# Patient Record
Sex: Female | Born: 1995 | Hispanic: Yes | Marital: Single | Smoking: Never smoker
Health system: Southern US, Community
[De-identification: ages and names within clinical notes are randomized; demographics above are authoritative.]

## PROBLEM LIST (undated history)

## (undated) ENCOUNTER — Inpatient Hospital Stay: Payer: Self-pay

## (undated) DIAGNOSIS — F419 Anxiety disorder, unspecified: Secondary | ICD-10-CM

## (undated) DIAGNOSIS — Z789 Other specified health status: Secondary | ICD-10-CM

## (undated) HISTORY — PX: NO PAST SURGERIES: SHX2092

---
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
## 2013-12-21 ENCOUNTER — Encounter (HOSPITAL_COMMUNITY): Payer: Self-pay | Admitting: Emergency Medicine

## 2013-12-21 DIAGNOSIS — O209 Hemorrhage in early pregnancy, unspecified: Secondary | ICD-10-CM | POA: Insufficient documentation

## 2013-12-21 NOTE — ED Notes (Signed)
Pt here from home with c/o vaginal bleeding that started  2 days ago with some small clots , no pain associated , pt was told that she was [redacted] weeks pregnant at the health dept.

## 2013-12-22 ENCOUNTER — Emergency Department (HOSPITAL_COMMUNITY)
Admission: EM | Admit: 2013-12-22 | Discharge: 2013-12-22 | Discharge status: Against Medical Advice | Payer: Self-pay | Attending: Emergency Medicine | Admitting: Emergency Medicine

## 2013-12-22 NOTE — ED Notes (Signed)
Pt informed of delay; pt decided to leave

## 2015-11-26 ENCOUNTER — Other Ambulatory Visit: Payer: Self-pay | Admitting: Advanced Practice Midwife

## 2015-11-26 DIAGNOSIS — Z3491 Encounter for supervision of normal pregnancy, unspecified, first trimester: Secondary | ICD-10-CM

## 2015-11-26 DIAGNOSIS — Z369 Encounter for antenatal screening, unspecified: Secondary | ICD-10-CM

## 2015-11-27 LAB — OB RESULTS CONSOLE GBS: STREP GROUP B AG: NEGATIVE

## 2015-11-27 LAB — OB RESULTS CONSOLE RPR: RPR: NONREACTIVE

## 2015-11-27 LAB — OB RESULTS CONSOLE VARICELLA ZOSTER ANTIBODY, IGG: VARICELLA IGG: IMMUNE

## 2015-11-27 LAB — OB RESULTS CONSOLE ABO/RH: RH TYPE: POSITIVE

## 2015-11-27 LAB — OB RESULTS CONSOLE RUBELLA ANTIBODY, IGM: RUBELLA: IMMUNE

## 2015-11-27 LAB — OB RESULTS CONSOLE HIV ANTIBODY (ROUTINE TESTING): HIV: NONREACTIVE

## 2015-11-27 LAB — OB RESULTS CONSOLE ANTIBODY SCREEN: Antibody Screen: NEGATIVE

## 2015-11-27 LAB — OB RESULTS CONSOLE GC/CHLAMYDIA
Chlamydia: NEGATIVE
Gonorrhea: NEGATIVE

## 2015-11-27 LAB — OB RESULTS CONSOLE HEPATITIS B SURFACE ANTIGEN: HEP B S AG: NEGATIVE

## 2015-12-01 ENCOUNTER — Ambulatory Visit
Admission: RE | Admit: 2015-12-01 | Discharge: 2015-12-01 | Disposition: A | Discharge status: Home / Self Care | Payer: Self-pay | Source: Ambulatory Visit | Attending: Advanced Practice Midwife | Admitting: Advanced Practice Midwife

## 2015-12-01 DIAGNOSIS — Z3491 Encounter for supervision of normal pregnancy, unspecified, first trimester: Secondary | ICD-10-CM

## 2015-12-01 DIAGNOSIS — Z3A01 Less than 8 weeks gestation of pregnancy: Secondary | ICD-10-CM | POA: Insufficient documentation

## 2015-12-01 DIAGNOSIS — Z36 Encounter for antenatal screening of mother: Secondary | ICD-10-CM | POA: Insufficient documentation

## 2015-12-18 ENCOUNTER — Ambulatory Visit (HOSPITAL_BASED_OUTPATIENT_CLINIC_OR_DEPARTMENT_OTHER)
Admission: RE | Admit: 2015-12-18 | Discharge: 2015-12-18 | Disposition: A | Discharge status: Home / Self Care | Payer: Self-pay | Source: Ambulatory Visit | Attending: Obstetrics & Gynecology | Admitting: Obstetrics & Gynecology

## 2015-12-18 ENCOUNTER — Ambulatory Visit
Admission: RE | Admit: 2015-12-18 | Discharge: 2015-12-18 | Disposition: A | Discharge status: Home / Self Care | Payer: Self-pay | Source: Ambulatory Visit | Attending: Advanced Practice Midwife | Admitting: Advanced Practice Midwife

## 2015-12-18 VITALS — BP 124/61 | HR 90 | Temp 98.2°F | Resp 18 | Ht 62.4 in | Wt 163.2 lb

## 2015-12-18 DIAGNOSIS — Z369 Encounter for antenatal screening, unspecified: Secondary | ICD-10-CM

## 2015-12-18 DIAGNOSIS — Z3490 Encounter for supervision of normal pregnancy, unspecified, unspecified trimester: Secondary | ICD-10-CM | POA: Insufficient documentation

## 2015-12-18 DIAGNOSIS — Z36 Encounter for antenatal screening of mother: Secondary | ICD-10-CM

## 2015-12-18 HISTORY — DX: Other specified health status: Z78.9

## 2015-12-18 NOTE — Progress Notes (Addendum)
Referring physician:  [facility] Department Length of Consultation: 30 minutes   [name]  was referred to [facility] for genetic counseling to review prenatal screening and testing options.  This note summarizes the information we discussed.    We offered the following routine screening tests for this pregnancy:  First trimester screening, which includes nuchal translucency ultrasound screen and first trimester maternal serum marker screening.  The nuchal translucency has approximately an 80% detection rate for Down syndrome and can be positive for other chromosome abnormalities as well as congenital heart defects.  When combined with a maternal serum marker screening, the detection rate is up to 90% for Down syndrome and up to 97% for trisomy 18.     Maternal serum marker screening, a blood test that measures pregnancy proteins, can provide risk assessments for Down syndrome, trisomy 18, and open neural tube defects (spina bifida, anencephaly). Because it does not directly examine the fetus, it cannot positively diagnose or rule out these problems.  Targeted ultrasound uses high frequency sound waves to create an image of the developing fetus.  An ultrasound is often recommended as a routine means of evaluating the pregnancy.  It is also used to screen for fetal anatomy problems (for example, a heart defect) that might be suggestive of a chromosomal or other abnormality.   Should these screening tests indicate an increased concern, then the following additional testing options would be offered:  The chorionic villus sampling procedure is available for first trimester chromosome analysis.  This involves the withdrawal of a small amount of chorionic villi (tissue from the developing placenta).  Risk of pregnancy loss is estimated to be approximately 1 in 200 to 1 in 100 (0.5 to 1%).  There is approximately a 1% (1 in 100) chance that the CVS chromosome  results will be unclear.  Chorionic villi cannot be tested for neural tube defects.     Amniocentesis involves the removal of a small amount of amniotic fluid from the sac surrounding the fetus with the use of a thin needle inserted through the maternal abdomen and uterus.  Ultrasound guidance is used throughout the procedure.  Fetal cells from amniotic fluid are directly evaluated and > 99.5% of chromosome problems and > 98% of open neural tube defects can be detected. This procedure is generally performed after the 15th week of pregnancy.  The main risks to this procedure include complications leading to miscarriage in less than 1 in 200 cases (0.5%).  As another option for information if the pregnancy is suspected to be an an increased chance for certain chromosome conditions, we also reviewed the availability of cell free fetal DNA testing from maternal blood to determine whether or not the baby may have either Down syndrome, trisomy 65, or trisomy 25.  This test utilizes a maternal blood sample and DNA sequencing technology to isolate circulating cell free fetal DNA from maternal plasma.  The fetal DNA can then be analyzed for DNA sequences that are derived from the three most common chromosomes involved in aneuploidy, chromosomes 13, 18, and 21.  If the overall amount of DNA is greater than the expected level for any of these chromosomes, aneuploidy is suspected.  While we do not consider it a replacement for invasive testing and karyotype analysis, a negative result from this testing would be reassuring, though not a guarantee of a normal chromosome complement for the baby.  An abnormal result is certainly suggestive of an abnormal chromosome  complement, though we would still recommend CVS or amniocentesis to confirm any findings from this testing.  We obtained a detailed family history and pregnancy history.  The family history was reported to be unremarkable for birth defects, mental retardation,  recurrent pregnancy loss or known chromosome abnormalities.  [name] stated that this is her second pregnancy.  She and her partner have a healthy 21 year old daughter.  She reported no complications or exposures in this pregnancy that would be expected to increase the risk for birth defects.  After consideration of the options, [name] elected to decline all screening tests.  She elected to return to our clinic at approximately 18 weeks for an anatomy ultrasound.  Because she declined screening and there was no other medical indication for an ultrasound, no imaging was done today.  [name] was encouraged to call with questions or concerns.  We can be contacted at [phone].  Sonia Andersoneborah F. Wells, MS, CGC  I was present in the clinic for this evaluation. [email or web address]

## 2015-12-28 NOTE — L&D Delivery Note (Signed)
Delivery Note  First Stage: Labor onset: 06/21/16 @ 2210 Augmentation : none Analgesia Eliezer Lofts/Anesthesia intrapartum: 1mg  Stadol IVP x 1 SROM at 2210  Second Stage: Complete dilation at 0259 Onset of pushing at 0259 FHR second stage: 135 bpm/ Moderate variability/ +accels/ no decels   Delivery of a viable female "[name]" at 0310 by Carlean JewsMeredith Jaydis Duchene, CNM in LOA position No nuchal cord Cord double clamped after cessation of pulsation, cut by FOB Cord blood sample collected   Third Stage: Placenta delivered via Tomasa BlaseSchultz intact with 3 VC @ 0314 Placenta disposition: hospital disposal Uterine tone boggy with massage / bleeding brisk - 800mcg Cytotec given rectally, Pitocin 500mL bolus infusing - fundus firm with massage  1st degree perineal laceration identified  Anesthesia for repair: 1% Lidocaine 30 mL Repair 3.0 Vicryl, 2.0 Vicryl Est. Blood Loss (mL): 450 mL  Complications: none  Mom to postpartum.  Baby to Couplet care / Skin to Skin.  Newborn: Birth Weight:  Pending  Apgar Scores: 8, 9 Feeding planned: Breast/Bottle  Carlean JewsMeredith Shawnta Zimbelman, CNM

## 2015-12-30 ENCOUNTER — Encounter: Payer: Self-pay | Admitting: Emergency Medicine

## 2015-12-30 ENCOUNTER — Emergency Department: Payer: Self-pay

## 2015-12-30 ENCOUNTER — Emergency Department
Admission: EM | Admit: 2015-12-30 | Discharge: 2015-12-30 | Disposition: A | Discharge status: Home / Self Care | Payer: Self-pay | Attending: Emergency Medicine | Admitting: Emergency Medicine

## 2015-12-30 DIAGNOSIS — Z3490 Encounter for supervision of normal pregnancy, unspecified, unspecified trimester: Secondary | ICD-10-CM

## 2015-12-30 DIAGNOSIS — R1031 Right lower quadrant pain: Secondary | ICD-10-CM | POA: Insufficient documentation

## 2015-12-30 DIAGNOSIS — Z79899 Other long term (current) drug therapy: Secondary | ICD-10-CM | POA: Insufficient documentation

## 2015-12-30 DIAGNOSIS — O9989 Other specified diseases and conditions complicating pregnancy, childbirth and the puerperium: Secondary | ICD-10-CM | POA: Insufficient documentation

## 2015-12-30 DIAGNOSIS — Z3A13 13 weeks gestation of pregnancy: Secondary | ICD-10-CM | POA: Insufficient documentation

## 2015-12-30 DIAGNOSIS — R102 Pelvic and perineal pain: Secondary | ICD-10-CM | POA: Insufficient documentation

## 2015-12-30 LAB — CBC
HCT: 37.8 % (ref 35.0–47.0)
Hemoglobin: 13 g/dL (ref 12.0–16.0)
MCH: 30.6 pg (ref 26.0–34.0)
MCHC: 34.4 g/dL (ref 32.0–36.0)
MCV: 88.7 fL (ref 80.0–100.0)
Platelets: 219 10*3/uL (ref 150–440)
RBC: 4.26 MIL/uL (ref 3.80–5.20)
RDW: 13.1 % (ref 11.5–14.5)
WBC: 11.4 10*3/uL — ABNORMAL HIGH (ref 3.6–11.0)

## 2015-12-30 LAB — CHLAMYDIA/NGC RT PCR (ARMC ONLY)
Chlamydia Tr: NOT DETECTED
N gonorrhoeae: NOT DETECTED

## 2015-12-30 LAB — URINALYSIS COMPLETE WITH MICROSCOPIC (ARMC ONLY)
BILIRUBIN URINE: NEGATIVE
GLUCOSE, UA: NEGATIVE mg/dL
Hgb urine dipstick: NEGATIVE
Ketones, ur: NEGATIVE mg/dL
Nitrite: NEGATIVE
Protein, ur: NEGATIVE mg/dL
Specific Gravity, Urine: 1.013 (ref 1.005–1.030)
pH: 5 (ref 5.0–8.0)

## 2015-12-30 LAB — WET PREP, GENITAL
CLUE CELLS WET PREP: NONE SEEN
SPERM: NONE SEEN
TRICH WET PREP: NONE SEEN

## 2015-12-30 LAB — HCG, QUANTITATIVE, PREGNANCY: HCG, BETA CHAIN, QUANT, S: 101207 m[IU]/mL — AB (ref <5)

## 2015-12-30 LAB — ABO/RH: ABO/RH(D): O POS

## 2015-12-30 MED ORDER — ACETAMINOPHEN 325 MG PO TABS
650.0000 mg | ORAL_TABLET | Freq: Once | ORAL | Status: AC
  Administered 2015-12-30: 650 mg via ORAL
  Filled 2015-12-30: qty 2

## 2015-12-30 NOTE — ED Notes (Signed)
Patient ambulatory to triage with steady gait, without difficulty or distress noted; pt reports awoke with left lower abd pain; st approx [redacted]wks pregnant; pt at health department with no complications

## 2015-12-30 NOTE — ED Provider Notes (Signed)
[facility] Emergency Department Provider Note  ____________________________________________  Time seen: Approximately 521 AM  I have reviewed the triage vital signs and the nursing notes.   HISTORY  Chief Complaint Abdominal Pain    HPI Sonia Rodriguez is a 20 y.o. female who comes into the hospital today with some lower abdominal pain. The patient is [redacted] weeks pregnant and reports that 2 AM she got a sharp pain in her right lower quadrant. She reports that the pain stopped but it returned and stated. She reports that initially the pain was a 7 out of 10 but is currently a 5 out of 10 in intensity. The patient had an ultrasound done on December 5 and did not have any complications at that time. The patient denies any nausea or vomiting and has no problems when she urinates. The patient has not had new vaginal bleeding or discharge. The patient has a history of an ectopic pregnancy in 2014 but reports it was on the left. The patient is a G3 P1 011. The patient did not take anything for the pain but decided to come in for evaluation.   Past Medical History  Diagnosis Date  . Medical history non-contributory     Patient Active Problem List   Diagnosis Date Noted  . Pregnancy 12/18/2015  . First trimester screening 12/18/2015    Past Surgical History  Procedure Laterality Date  . No past surgeries      Current Outpatient Rx  Name  Route  Sig  Dispense  Refill  . Prenatal Vit-Fe Fumarate-FA (PRENATAL MULTIVITAMIN) TABS tablet   Oral   Take 1 tablet by mouth daily at 12 noon.           Allergies Review of patient's allergies indicates no known allergies.  No family history on file.  Social History Social History  Substance Use Topics  . Smoking status: Never Smoker   . Smokeless tobacco: Never Used  . Alcohol Use: No    Review of Systems Constitutional: No fever/chills Eyes: No visual changes. ENT: No sore throat. Cardiovascular:  Denies chest pain. Respiratory: Denies shortness of breath. Gastrointestinal:  abdominal pain.  No nausea, no vomiting.  No diarrhea.  No constipation. Genitourinary: Negative for dysuria. Musculoskeletal: Negative for back pain. Skin: Negative for rash. Neurological: Negative for headaches, focal weakness or numbness.  10-point ROS otherwise negative.  ____________________________________________   PHYSICAL EXAM:  VITAL SIGNS: ED Triage Vitals  Enc Vitals Group     BP 12/30/15 0454 121/63 mmHg     Pulse Rate 12/30/15 0454 73     Resp 12/30/15 0454 18     Temp 12/30/15 0454 98 F (36.7 C)     Temp Source 12/30/15 0454 Oral     SpO2 12/30/15 0454 100 %     Weight 12/30/15 0454 163 lb (73.936 kg)     Height 12/30/15 0454 5\' 2"  (1.575 m)     Head Cir --      Peak Flow --      Pain Score 12/30/15 0455 6     Pain Loc --      Pain Edu? --      Excl. in GC? --     Constitutional: Alert and oriented. Well appearing and in mild distress. Eyes: Conjunctivae are normal. PERRL. EOMI. Head: Atraumatic. Nose: No congestion/rhinnorhea. Mouth/Throat: Mucous membranes are moist.  Oropharynx non-erythematous. Cardiovascular: Normal rate, regular rhythm. Grossly normal heart sounds.  Good peripheral circulation. Respiratory: Normal respiratory  effort.  No retractions. Lungs CTAB. Gastrointestinal: Soft and nontender. No distention. Positive bowel sounds Genitourinary: Normal external genitalia with right adnexal tenderness to palpation, cervix closed and uterus enlarged consistent with pregnancy dates. Musculoskeletal: No lower extremity tenderness nor edema.  No joint effusions. Neurologic:  Normal speech and language.  Skin:  Skin is warm, dry and intact.  Psychiatric: Mood and affect are normal.   ____________________________________________   LABS (all labs ordered are listed, but only abnormal results are displayed)  Labs Reviewed  WET PREP, GENITAL - Abnormal; Notable for  the following:    Yeast Wet Prep HPF POC FEW (*)    WBC, Wet Prep HPF POC MANY (*)    All other components within normal limits  CBC - Abnormal; Notable for the following:    WBC 11.4 (*)    All other components within normal limits  HCG, QUANTITATIVE, PREGNANCY - Abnormal; Notable for the following:    hCG, Beta Chain, Quant, S 101207 (*)    All other components within normal limits  URINALYSIS COMPLETEWITH MICROSCOPIC ([facility] ONLY) - Abnormal; Notable for the following:    Color, Urine YELLOW (*)    APPearance CLEAR (*)    Leukocytes, UA TRACE (*)    Bacteria, UA RARE (*)    Squamous Epithelial / LPF 0-5 (*)    All other components within normal limits  CHLAMYDIA/NGC RT PCR ([facility] ONLY)  ABO/RH   ____________________________________________  EKG  None ____________________________________________  RADIOLOGY  Ultrasound: Single live intrauterine pregnancy with crown-rump length of 6.9 cm corresponding to a gestational age of [redacted] weeks and 1 day. This matches the gestational age of [redacted] weeks 4 days by LMP reflecting an estimated date of delivery of 07/02/2016. ____________________________________________   PROCEDURES  Procedure(s) performed: None  Critical Care performed: No  ____________________________________________   INITIAL IMPRESSION / ASSESSMENT AND PLAN / ED COURSE  Pertinent labs & imaging results that were available during my care of the patient were reviewed by me and considered in my medical decision making (see chart for details).  This is a 20 year old female who comes into the hospital today with right lower quadrant pain. The patient is [redacted] weeks pregnant. The patient has more adnexal tenderness although it comes and goes compared to right lower quadrant tenderness to palpation. We checked the patient's blood work to include a CBC and a urinalysis and also did a wet prep. I will send the patient for an ultrasound to evaluate possible cause of her  pain and give her dose of Tylenol. Patient will be reassessed when she's returned from ultrasound.  I did go back in to reassess the patient. At this time the patient reports that she's feeling well. I explained to the patient the thought of this pain not reflecting appendicitis. The pain is intermittent she's had no secondary symptoms such as anorexia, nausea or fever. This pain is more consistent with either ovarian pain which her right ovary measures larger than her left ovary or some early round ligament pain which can start in the second trimester. I explained to the patient that she can always use Tylenol to treat her pain and that she should follow back up with her OB/GYN. I also explained to the patient and her husband that should she develop any fevers, nausea, vomiting or worsened pain she should return to the emergency department. The patient displays understanding and agrees with the plan as stated. She'll be discharged home. ____________________________________________   FINAL CLINICAL IMPRESSION(S) / ED  DIAGNOSES  Final diagnoses:  Pregnancy  Right lower quadrant abdominal pain      Rebecka Apley, MD 12/30/15 [phone]

## 2015-12-30 NOTE — ED Notes (Signed)
Pr presents to the ED with c/o right lower abdominal pain that started earlier this morning. Pt denies any N/V/D or fever; pt also denies any issues with urinary burning, difficulty, or frequency. Pt is currently [redacted] weeks pregnant; denies any c/o vaginal bleeding or d/c. Pt is A&O, in NAD, with respirations regular, even and unlabored, with s/o and family at bedside.

## 2015-12-30 NOTE — Discharge Instructions (Signed)
Abdominal Pain, Adult Many things can cause abdominal pain. Usually, abdominal pain is not caused by a disease and will improve without treatment. It can often be observed and treated at home. Your health care provider will do a physical exam and possibly order blood tests and X-rays to help determine the seriousness of your pain. However, in many cases, more time must pass before a clear cause of the pain can be found. Before that point, your health care provider may not know if you need more testing or further treatment. HOME CARE INSTRUCTIONS Monitor your abdominal pain for any changes. The following actions may help to alleviate any discomfort you are experiencing:  Only take over-the-counter or prescription medicines as directed by your health care provider.  Do not take laxatives unless directed to do so by your health care provider.  Try a clear liquid diet (broth, tea, or water) as directed by your health care provider. Slowly move to a bland diet as tolerated. SEEK MEDICAL CARE IF:  You have unexplained abdominal pain.  You have abdominal pain associated with nausea or diarrhea.  You have pain when you urinate or have a bowel movement.  You experience abdominal pain that wakes you in the night.  You have abdominal pain that is worsened or improved by eating food.  You have abdominal pain that is worsened with eating fatty foods.  You have a fever. SEEK IMMEDIATE MEDICAL CARE IF:  Your pain does not go away within 2 hours.  You keep throwing up (vomiting).  Your pain is felt only in portions of the abdomen, such as the right side or the left lower portion of the abdomen.  You pass bloody or black tarry stools. MAKE SURE YOU:  Understand these instructions.  Will watch your condition.  Will get help right away if you are not doing well or get worse.   This information is not intended to replace advice given to you by your health care provider. Make sure you discuss  any questions you have with your health care provider.   Document Released: 09/22/2005 Document Revised: 09/03/2015 Document Reviewed: 08/22/2013 Elsevier Interactive Patient Education 2016 Elsevier Inc.  Round Ligament Pain The round ligament is a cord of muscle and tissue that helps to support the uterus. It can become a source of pain during pregnancy if it becomes stretched or twisted as the baby grows. The pain usually begins in the second trimester of pregnancy, and it can come and go until the baby is delivered. It is not a serious problem, and it does not cause harm to the baby. Round ligament pain is usually a short, sharp, and pinching pain, but it can also be a dull, lingering, and aching pain. The pain is felt in the lower side of the abdomen or in the groin. It usually starts deep in the groin and moves up to the outside of the hip area. Pain can occur with:  A sudden change in position.  Rolling over in bed.  Coughing or sneezing.  Physical activity. HOME CARE INSTRUCTIONS Watch your condition for any changes. Take these steps to help with your pain:  When the pain starts, relax. Then try:  Sitting down.  Flexing your knees up to your abdomen.  Lying on your side with one pillow under your abdomen and another pillow between your legs.  Sitting in a warm bath for 15-20 minutes or until the pain goes away.  Take over-the-counter and prescription medicines only as told by  your health care provider.  Move slowly when you sit and stand.  Avoid long walks if they cause pain.  Stop or lessen your physical activities if they cause pain. SEEK MEDICAL CARE IF:  Your pain does not go away with treatment.  You feel pain in your back that you did not have before.  Your medicine is not helping. SEEK IMMEDIATE MEDICAL CARE IF:  You develop a fever or chills.  You develop uterine contractions.  You develop vaginal bleeding.  You develop nausea or vomiting.  You  develop diarrhea.  You have pain when you urinate.   This information is not intended to replace advice given to you by your health care provider. Make sure you discuss any questions you have with your health care provider.   Document Released: 09/21/2008 Document Revised: 03/06/2012 Document Reviewed: 02/19/2015 Elsevier Interactive Patient Education [employer]2016 Elsevier Inc.

## 2016-01-19 LAB — HM PAP SMEAR: HM Pap smear: NEGATIVE

## 2016-01-19 NOTE — Addendum Note (Signed)
Encounter addended by: [place] Lorenzo Arscott, MD on: 01/19/2016 12:51 PM<BR>     Documentation filed: Charges VN, Notes Section, Problem List

## 2016-02-05 ENCOUNTER — Ambulatory Visit
Admission: RE | Admit: 2016-02-05 | Discharge: 2016-02-05 | Disposition: A | Discharge status: Home / Self Care | Payer: Self-pay | Source: Ambulatory Visit | Attending: Obstetrics and Gynecology | Admitting: Obstetrics and Gynecology

## 2016-02-05 ENCOUNTER — Ambulatory Visit: Payer: Self-pay

## 2016-02-05 VITALS — BP 125/78 | HR 108 | Temp 98.2°F | Resp 18 | Wt 168.2 lb

## 2016-02-05 DIAGNOSIS — Z3490 Encounter for supervision of normal pregnancy, unspecified, unspecified trimester: Secondary | ICD-10-CM

## 2016-02-05 DIAGNOSIS — Z369 Encounter for antenatal screening, unspecified: Secondary | ICD-10-CM

## 2016-02-05 DIAGNOSIS — Z3A18 18 weeks gestation of pregnancy: Secondary | ICD-10-CM | POA: Insufficient documentation

## 2016-02-05 DIAGNOSIS — Z36 Encounter for antenatal screening of mother: Secondary | ICD-10-CM | POA: Insufficient documentation

## 2016-04-06 IMAGING — US US OB COMP LESS 14 WK
1 series · 13 of 28 positions shown · non-contrast
Comparison: Pelvic ultrasound performed 12/01/2015

CLINICAL DATA: Acute onset of right lower quadrant abdominal pain.
Initial encounter.

EXAM:
OBSTETRIC <14 WK ULTRASOUND
TECHNIQUE: Transabdominal ultrasound was performed for evaluation of the
gestation as well as the maternal uterus and adnexal regions.

[Series 1: us ob comp less 14 wk · 0.22mm/px · 13 of 50 slices shown]
[im 2/50]
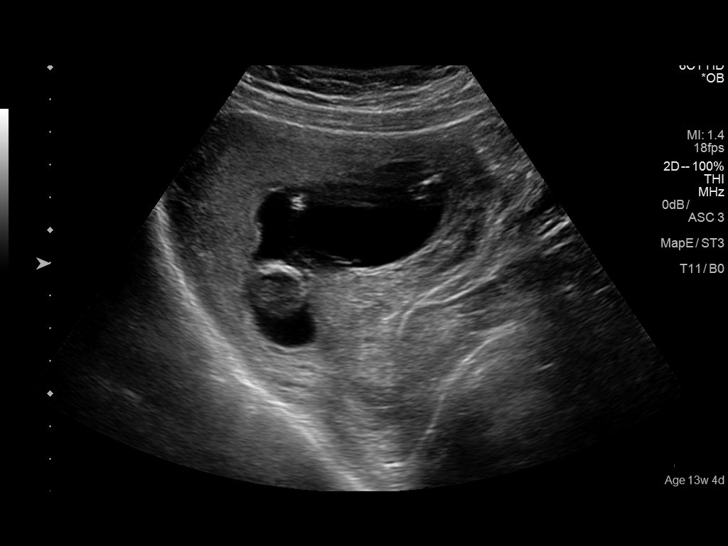
[im 6/50]
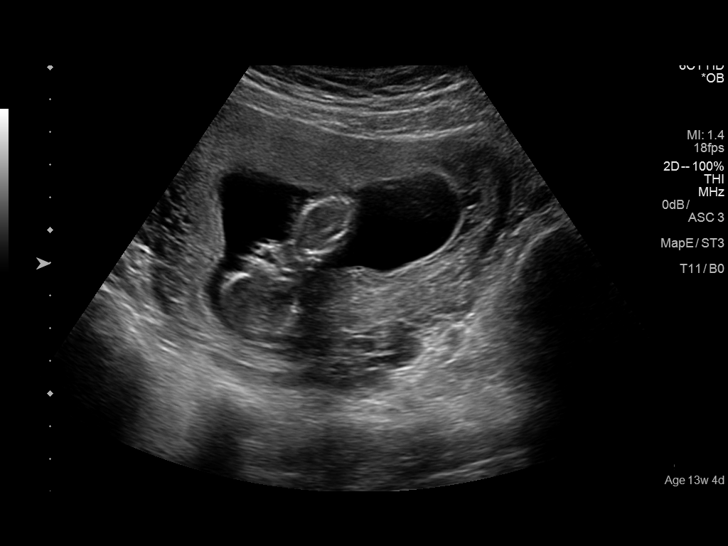
[im 10/50]
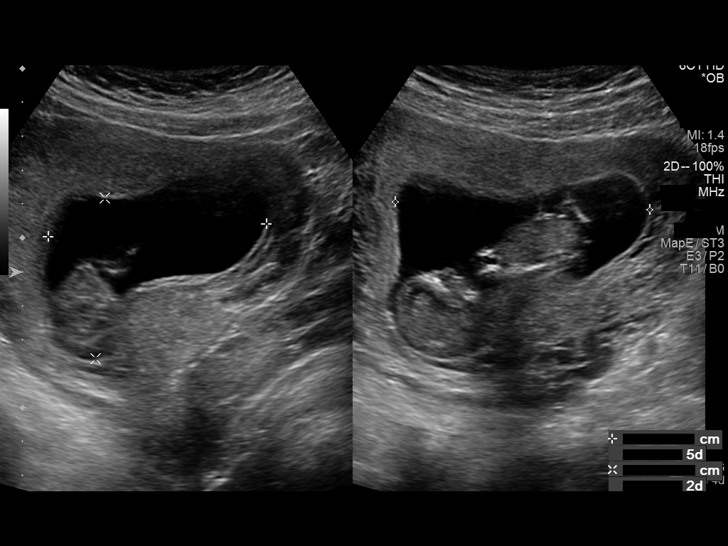
[im 13/50]
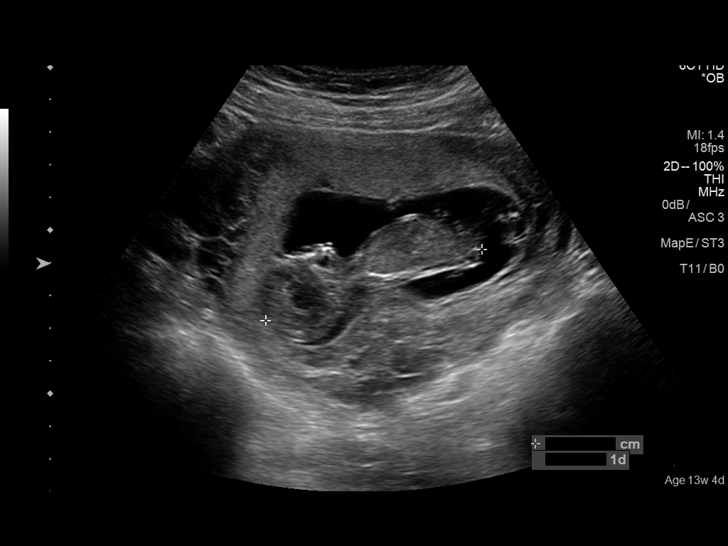
[im 17/50]
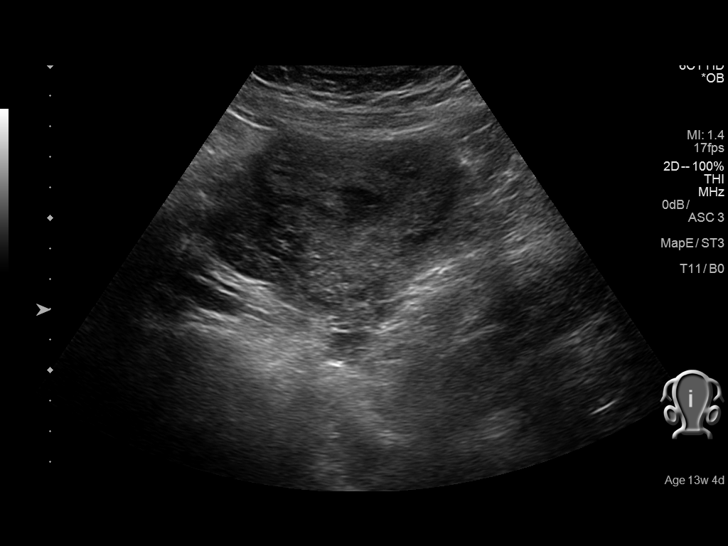
[im 20/50]
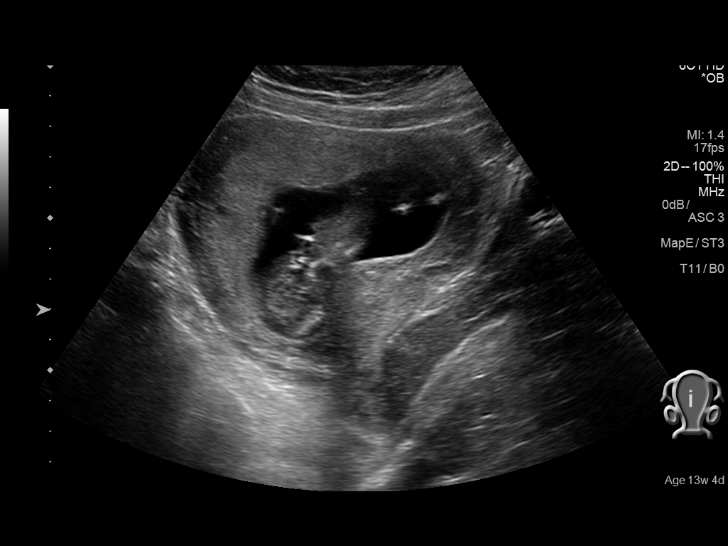
[im 26/50]
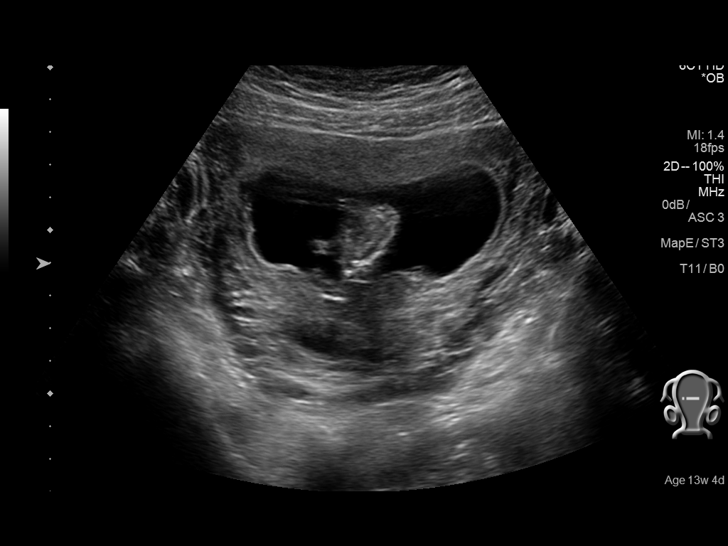
[im 30/50]
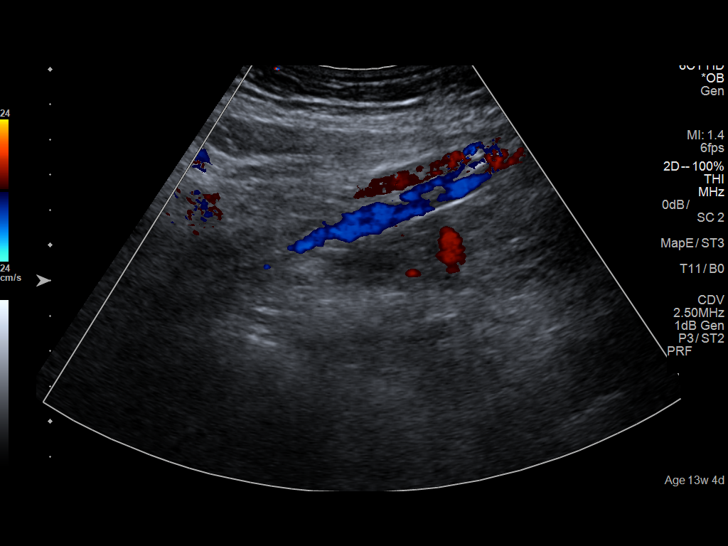
[im 33/50]
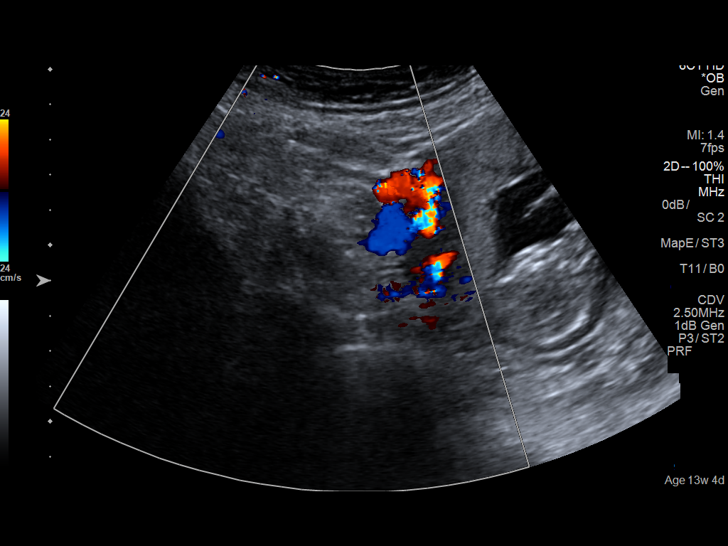
[im 37/50]
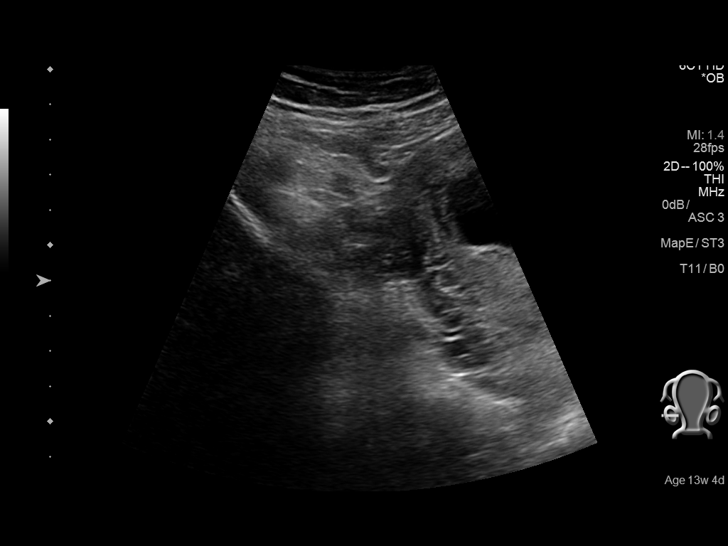
[im 40/50]
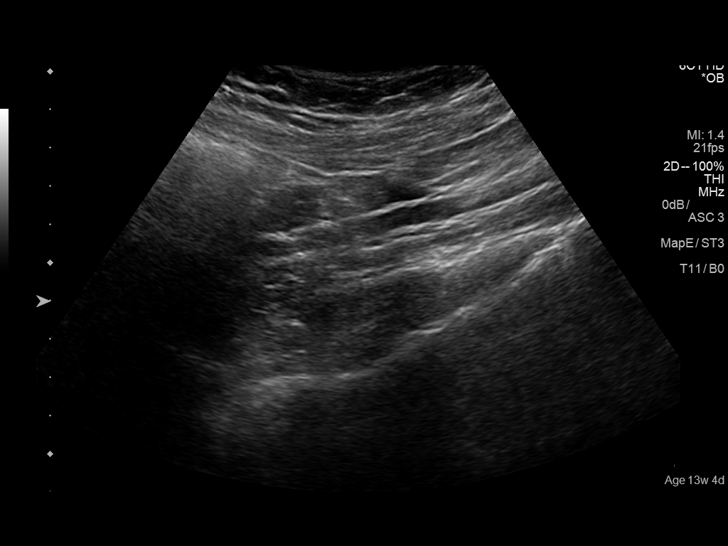
[im 44/50]
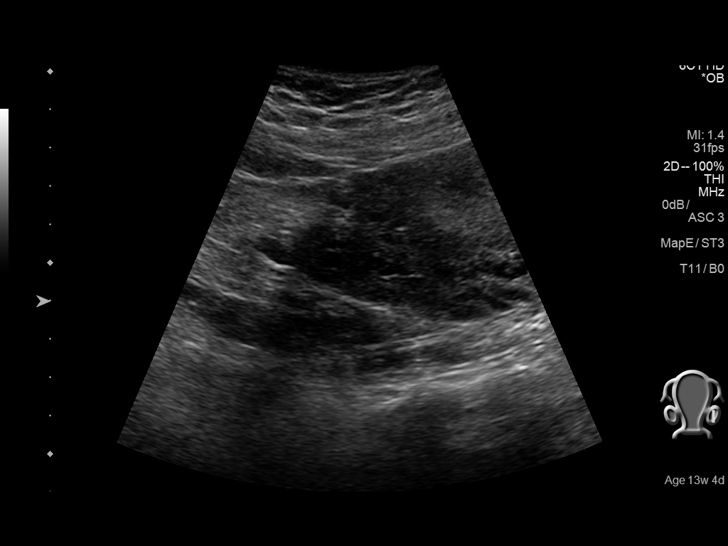
[im 48/50]
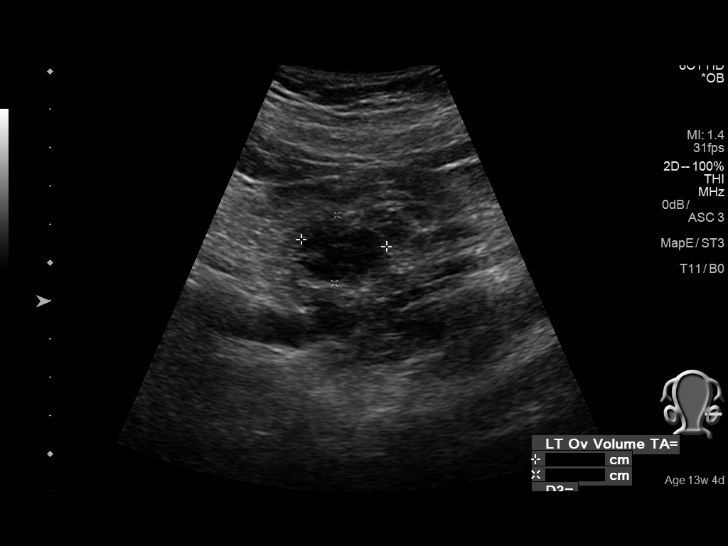

[13 of 28 positions shown; findings below may reference images not displayed]

FINDINGS: Intrauterine gestational sac: Visualized/normal in shape.

Yolk sac:  No

Embryo:  Yes

Cardiac Activity: Yes

Heart Rate: 150 bpm

CRL:   6.9 cm   13 w 1 d                  US EDC: 07/05/2016

Subchorionic hemorrhage:  No subchorionic hemorrhage is noted.

Maternal uterus/adnexae: The uterus is unremarkable in appearance.

The ovaries are within normal limits. The right ovary measures 4.1 x
2.3 x 1.9 cm, while the left ovary measures 3.0 x 2.2 x 1.8 cm. No
suspicious adnexal masses are seen; there is no evidence for ovarian
torsion on limited color Doppler evaluation.

No free fluid is seen within the pelvic cul-de-sac.
IMPRESSION: Single live intrauterine pregnancy, with a crown-rump length of
cm, corresponding to a gestational age of 13 weeks 1 day. This
matches the gestational age of 13 weeks 4 days by LMP, reflecting an
estimated date of delivery July 02, 2016.

## 2016-04-22 LAB — OB RESULTS CONSOLE GBS: STREP GROUP B AG: NEGATIVE

## 2016-05-13 ENCOUNTER — Encounter: Payer: Self-pay | Admitting: *Deleted

## 2016-05-13 ENCOUNTER — Observation Stay
Admission: EM | Admit: 2016-05-13 | Discharge: 2016-05-13 | Disposition: A | Discharge status: Home / Self Care | Payer: Self-pay | Attending: Obstetrics and Gynecology | Admitting: Obstetrics and Gynecology

## 2016-05-13 DIAGNOSIS — Z3490 Encounter for supervision of normal pregnancy, unspecified, unspecified trimester: Secondary | ICD-10-CM

## 2016-05-13 DIAGNOSIS — Z369 Encounter for antenatal screening, unspecified: Secondary | ICD-10-CM

## 2016-05-13 DIAGNOSIS — Z3A32 32 weeks gestation of pregnancy: Secondary | ICD-10-CM | POA: Insufficient documentation

## 2016-05-13 DIAGNOSIS — O4703 False labor before 37 completed weeks of gestation, third trimester: Principal | ICD-10-CM | POA: Insufficient documentation

## 2016-05-13 MED ORDER — BETAMETHASONE SOD PHOS & ACET 6 (3-3) MG/ML IJ SUSP
12.0000 mg | Freq: Once | INTRAMUSCULAR | Status: AC
  Administered 2016-05-13: 12 mg via INTRAMUSCULAR
  Filled 2016-05-13: qty 1

## 2016-05-13 MED ORDER — TERBUTALINE SULFATE 1 MG/ML IJ SOLN
0.2500 mg | Freq: Once | INTRAMUSCULAR | Status: AC
  Administered 2016-05-13: 0.25 mg via SUBCUTANEOUS

## 2016-05-13 MED ORDER — BETAMETHASONE SOD PHOS & ACET 6 (3-3) MG/ML IJ SUSP
12.0000 mg | Freq: Once | INTRAMUSCULAR | Status: DC
Start: 2016-05-14 — End: 2016-05-13

## 2016-05-13 MED ORDER — TERBUTALINE SULFATE 1 MG/ML IJ SOLN
INTRAMUSCULAR | Status: AC
  Administered 2016-05-13: 0.25 mg via SUBCUTANEOUS
  Filled 2016-05-13: qty 1

## 2016-05-13 NOTE — Progress Notes (Signed)
FHT reactive, abdominal pain has improved, pt rates 4/10, mostly cramping, VSS, afebrile. No cervical change with exams during triage stay. Pt confirms medication given has helped with stopping contractions. EFM d'ced. Discharge instructions and teaching completed with pt and s/o, discharge paperwork including handouts related to preterm labor precautions provided to pt. Stressed importance of pt returning to hospital triage tomorrow b/t 2:00 - 2:30p for 2nd betamethasone inj. Encouraged pt to rest, stay well hydrated, pelvic rest and f/u with primary OB as scheduled June 1st or sooner if necessary. Pt is aware she may return to hospital with any worsening symptoms. Pt reports active fetal movement and feels fine with plan to discharge home.

## 2016-05-13 NOTE — Discharge Instructions (Signed)
Preterm Birth Preterm birth is a birth that happens before 37 weeks of pregnancy. Most pregnancies last about 39-41 weeks. Every week in the womb is important and is beneficial to the health of the infant. Infants born before 37 weeks of pregnancy are at a higher risk for complications. Depending on when the infant was born, he or she may be:  Late preterm. Born between 32 weeks and 37 weeks of pregnancy.  Very preterm. Born at less than 32 weeks of pregnancy.  Extremely preterm. Born at less than 25 weeks of pregnancy. The earlier a baby is born, the more likely the child will have issues related to prematurity. Complications and problems that can be seen in infants born too early include:  Problems breathing (respiratory distress syndrome).  Low birth weight.  Problems feeding.  Sleeping problems.  Yellowing of the skin (jaundice).  Infections such as pneumonia. Babies born very preterm or extremely preterm are at risk for more serious medical issues. These include:  More severe breathing issues.  Eyesight issues.  Brain development issues (intraventricular hemorrhage).  Behavioral and emotional development issues.  Growth and developmental delays.  Cerebral palsy.  Serious feeding or bowel complications (necrotizing enterocolitis). CAUSES  There are two broad categories of preterm birth.  Spontaneous preterm birth. This is a birth resulting from preterm labor (not medically induced) or preterm premature rupture of membranes (PPROM).  Indicated preterm birth. This is a birth resulting from labor being medically induced due to health, personal, or social reasons. RISK FACTORS Preterm birth may be related to certain medical conditions, lifestyle factors, or demographic factors encountered by the mother or fetus.  Medical conditions include:  Multiple gestations (twins, triplets, and so on).  Infection.  Diabetes.  Heart disease.  Kidney disease.  Cervical or  uterine abnormalities.  Being underweight.  High blood pressure or preeclampsia.  Premature rupture of membranes (PROM).  Birth defects in the fetus.  Lifestyle factors include:  Poor prenatal care.  Poor nutrition or anemia.  Cigarette smoking.  Consuming alcohol.  High levels of stress and lack of social or emotional support.  Exposure to chemical or environmental toxins.  Substance abuse.  Demographic factors include:  African-American ethnicity.  Age (younger than 18 or older than 22 years of age).  Low socioeconomic status. Women with a history of preterm labor or who become pregnant within 18 months of giving birth are also at increased risk for preterm birth. DIAGNOSIS  Your health care provider may request additional tests to diagnose underlying complications resulting from preterm birth. Tests on the infant may include:  Physical exam.  Blood tests.  Chest X-rays.  Heart-lung monitoring. TREATMENT  After birth, special care will be taken to assess any problems or complications for the infant. Supportive care will be provided for the infant. Treatment depends on what problems are present and any complications that develop. Some preterm infants are cared for in a neonatal intensive care unit. In general, care may include:  Maintaining temperature and oxygen in a clear heated box (baby isolette).  Monitoring the infant's heart rate, breathing, and level of oxygen in the blood.  Monitoring for signs of infection and, if needed, giving IV antibiotic medicine.  Inserting a feeding tube (nose, mouth) or giving IV nutrition if unable to feed.  Inserting a breathing tube (ventilation).  Respiration support (continuous positive airway pressure [CPAP] or oxygen). Treatment will change as the infant builds up strength and is able to breathe and eat on his or her   own. For some infants, no special treatment is necessary. Parents may be educated on the potential  health risks of prematurity to the infant. HOME CARE INSTRUCTIONS  Understand your infant's special conditions and needs. It may be reassuring to learn about infant CPR.  Monitor your infant in the car seat until he or she grows and matures. Infant car seats can cause breathing difficulties for preterm infants.  Keep your infant warm. Dress your infant in layers and keep him or her away from drafts, especially in cold months of the year.  Wash your hands thoroughly after going to the bathroom or changing a diaper. Late preterm infants may be more prone to infection.  Follow all your health care provider's instructions for providing support and care to your preterm infant.  Get support from organizations and groups that understand your challenges.  Follow up with your infant's health care provider as directed. Prevention There are some things you can do to help lower your risk of having a preterm infant in the future. These include:  Good prenatal care throughout the entire pregnancy. See a health care provider regularly for advice and tests.  Management of underlying medical conditions.  Proper self-care and lifestyle changes.  Proper diet and weight control.  Watching for signs of various infections. SEEK MEDICAL CARE IF:  Your infant has feeding difficulties.  Your infant has sleeping difficulties.  Your infant has breathing difficulties.  Your infant's skin starts to look yellow.  Your infant shows signs of infection, such as a stuffy nose, fever, crying, or bluish color of the skin. FOR MORE INFORMATION March of Dimes: www.marchofdimes.com Prematurity.org: www.prematurity.org   This information is not intended to replace advice given to you by your health care provider. Make sure you discuss any questions you have with your health care provider.   Document Released: 03/04/2004 Document Revised: 10/03/2013 Document Reviewed: 07/12/2013 Elsevier Interactive Patient  Education 2016 Elsevier Inc.  

## 2016-05-13 NOTE — OB Triage Note (Signed)
Pt advised she may take otc Tylenol if needed for mild lower abd cramps or lower back discomfort, if pain persist after pain med, pt told to return to be further evaluated.

## 2016-05-13 NOTE — Discharge Summary (Signed)
Obstetric Discharge Summary Reason for Admission:UC's since early am Prenatal Procedures: US Intrapartum Procedures: Ext fetal and uterine monitors Postpartum Procedures: Not delivered Complications-Operative and Postpartum:None HEMOGLOBIN  Date Value Ref Range Status  12/30/2015 13.0 12.0 - 16.0 g/dL Final   HGB  Date Value Ref Range Status  12/05/2012 12.7 12.0-16.0 g/dL Final   HCT  Date Value Ref Range Status  12/30/2015 37.8 35.0 - 47.0 % Final  12/05/2012 37.2 35.0-47.0 % Final    Physical Exam:  General: 20 yo Hispanic female in NAD.  Resp: reg and non-labored UC; none on monitor Cx; 1.5/50%/ no change after 1-2 hours  A: IUP at 32 weeks with Th PTL P; BMZ 1st dose today 2. FU tomorrow for 2nd dose of BMZ at 1400. Dr Dalbert GarnetBeasley aware and agrees with plan of care.    Discharge Diagnoses: Th PTL at 32 weeks  Discharge Information: Date: 05/13/2016 Activity: unrestricted Diet:Regular Medications: PNV Condition: stable Instructions: Rest, hydration and fu for BMZ tomorrow Discharge to: home Follow-up Information    Follow up with [facility] In 1 week.   Contact information:   [address] [place] [address] [place] [id number] [phone]       Newborn Data: This patient has no babies on file.   Sharee PimpleCaron W Berdia Lachman 05/13/2016, 4:33 PM

## 2016-05-13 NOTE — Progress Notes (Signed)
Patient ID: Sonia RanKaren M Cano Rodriguez, female   DOB: [birth year], 20 y.o.   MRN: [id number] Preparing to dc pt home due to no UC's. Pt states she is hurting with UC's but they are not being picked up. Will give 1st dose of Terb.

## 2016-05-13 NOTE — H&P (Signed)
Sonia Rodriguez is a 20 y.o. female presenting for UC's since 0600am. Pt goes to [facility] and states she has no high risk OB problems. Pt has only had an ectopic in the past. LMP of 09/26/15 with EDD of 07/02/2016. Pt has declined vag bleeding, LOF or decreased fM.  History OB History    Gravida Para Term Preterm AB TAB SAB Ectopic Multiple Living   3 1 1  1   1  1     PMH: Eating disorder/Special diets/PICA, Anemia  Past Surgical History  Procedure Laterality Date  . No past surgeries     Family History:MGM & MGF have Type 2 DM Social History:  reports that she has never smoked. She has never used smokeless tobacco. She reports that she does not drink alcohol or use illicit drugs.   Prenatal Transfer Tool  Maternal Diabetes:  Genetic Screening: Normal Maternal Ultrasounds/Referrals: Normal Fetal Ultrasounds or other Referrals:  WNL at [facility] Placenta post Maternal Substance Abuse:  [name] Significant Maternal Medications:   Significant Maternal Lab None Other Comments:   ROS  Dilation: 1.5 Effacement (%): 50 Station: -2 Exam by:: J.Suggs, RN Blood pressure 108/73, pulse 111, temperature 98.7 F (37.1 C), temperature source Oral, resp. rate 18, height 5' (1.524 m), weight 81.647 kg (180 lb), last menstrual period 09/26/2015. Exam Physical Exam  Prenatal labs: ABO, Rh: --/--/O POS (01/03 [id number]) Antibody: Negative (12/01 0000) Rubella: Immune (12/01 0000) RPR: Nonreactive (12/01 0000)  HBsAg: Negative (12/01 0000)  HIV: Non-reactive (12/01 0000)  GBS:   Not done yet  Assessment/Plan: 1. IUP at 32 weeks with Th PTL 2. BMZ 12.5 mg SubQ today and repeat at 1400 tomorrow. P: Home to rest. 2. FU as scheduled tomorrow for 2nd dose of BMZ. 3. Rest, hydrate and fu at [facility] as scheduled   Sonia Rodriguez 05/13/2016, 4:05 PM

## 2016-05-14 ENCOUNTER — Observation Stay
Admission: EM | Admit: 2016-05-14 | Discharge: 2016-05-14 | Disposition: A | Discharge status: Home / Self Care | Payer: Self-pay | Attending: Obstetrics and Gynecology | Admitting: Obstetrics and Gynecology

## 2016-05-14 DIAGNOSIS — Z3A32 32 weeks gestation of pregnancy: Secondary | ICD-10-CM | POA: Insufficient documentation

## 2016-05-14 DIAGNOSIS — Z3490 Encounter for supervision of normal pregnancy, unspecified, unspecified trimester: Secondary | ICD-10-CM

## 2016-05-14 DIAGNOSIS — O479 False labor, unspecified: Secondary | ICD-10-CM | POA: Diagnosis present

## 2016-05-14 DIAGNOSIS — Z369 Encounter for antenatal screening, unspecified: Secondary | ICD-10-CM

## 2016-05-14 DIAGNOSIS — O4700 False labor before 37 completed weeks of gestation, unspecified trimester: Principal | ICD-10-CM | POA: Diagnosis present

## 2016-05-14 DIAGNOSIS — O09293 Supervision of pregnancy with other poor reproductive or obstetric history, third trimester: Secondary | ICD-10-CM | POA: Insufficient documentation

## 2016-05-14 LAB — URINE CULTURE

## 2016-05-14 LAB — URINALYSIS COMPLETE WITH MICROSCOPIC (ARMC ONLY)
Bilirubin Urine: NEGATIVE
GLUCOSE, UA: NEGATIVE mg/dL
HGB URINE DIPSTICK: NEGATIVE
LEUKOCYTES UA: NEGATIVE
NITRITE: NEGATIVE
Protein, ur: NEGATIVE mg/dL
SPECIFIC GRAVITY, URINE: 1.005 (ref 1.005–1.030)
pH: 6 (ref 5.0–8.0)

## 2016-05-14 MED ORDER — LACTATED RINGERS IV SOLN
INTRAVENOUS | Status: DC
  Administered 2016-05-14: 06:00:00 via INTRAVENOUS

## 2016-05-14 MED ORDER — LACTATED RINGERS IV BOLUS (SEPSIS)
1000.0000 mL | Freq: Once | INTRAVENOUS | Status: AC
  Administered 2016-05-14: 1000 mL via INTRAVENOUS

## 2016-05-14 MED ORDER — TERBUTALINE SULFATE 1 MG/ML IJ SOLN
INTRAMUSCULAR | Status: AC
  Administered 2016-05-14: 0.25 mg via SUBCUTANEOUS
  Filled 2016-05-14: qty 1

## 2016-05-14 MED ORDER — BETAMETHASONE SOD PHOS & ACET 6 (3-3) MG/ML IJ SUSP
INTRAMUSCULAR | Status: AC
  Administered 2016-05-14: 12 mg
  Filled 2016-05-14: qty 1

## 2016-05-14 MED ORDER — TERBUTALINE SULFATE 1 MG/ML IJ SOLN
0.2500 mg | Freq: Once | INTRAMUSCULAR | Status: AC
  Administered 2016-05-14: 0.25 mg via SUBCUTANEOUS

## 2016-05-14 NOTE — Discharge Instructions (Signed)
Discharge instructions reviewed with patient. Patient verbalized understanding, pt. signed copies, copy given. Labor precautions reviewed with patient.

## 2016-05-14 NOTE — H&P (Signed)
Sonia Rodriguez is a 20 y.o. female presenting for UC's since yesterday. Pt goes to [facility] and states she has no high risk OB problems. Pt has only had an ectopic in the past. LMP of 09/26/15 with EDD of 07/02/2016.Pt was given Terb x 1 yest. And discharged due to no UC's seen.  Pt has declined vag bleeding, LOF or decreased fM.  History OB History    Gravida Para Term Preterm AB TAB SAB Ectopic Multiple Living   3 1 1  1   1  1     PMH: Eating disorder/Special diets/PICA, Anemia  Past Surgical History  Procedure Laterality Date  . No past surgeries     Family History:MGM & MGF have Type 2 DM Social History:  reports that she has never smoked. She has never used smokeless tobacco. She reports that she does not drink alcohol or use illicit drugs.   Prenatal Transfer Tool  Maternal Diabetes: WNL Genetic Screening: Normal Maternal Ultrasounds/Referrals: Normal Fetal Ultrasounds or other [facility] Maternal Substance Abuse: None Significant Maternal Medications: PNV Significant Maternal Lab None Other Comments:   ROS Benign x 9 + UC's that are uncomfortable  Dilation: 2 Effacement (%): 50 Station: -2 Exam by:: J.Suggs, RN Blood pressure 108/73, pulse 111, temperature 98.7 F (37.1 C), temperature source Oral, resp. rate 18, height 5' (1.524 m), weight 81.647 kg (180 lb), last menstrual period 09/26/2015. Exam Physical Exam  Gen:20 yo Hispanic female in NAD Heart:S1S2, RRR, No M/R/G.  Lungs: CTA biilat Abd: Gravid Prenatal labs: ABO, Rh: --/--/O POS (01/03 [id number]) Antibody: Negative (12/01 0000) Rubella: Immune (12/01 0000) RPR: Nonreactive (12/01 0000)  HBsAg: Negative (12/01 0000)  HIV: Non-reactive (12/01 0000)  GBS:   Not done yet  Assessment/Plan: 1. IUP at 32 weeks with Th PTL 2. BMZ 12.5 mg SubQ  yest and repeat at 1400 today.   P:External fetal and uterine monitors. 2. Will continue  to observe to make sure pt does not need further Terb.    Sonia Pimplearon W Sonia Rodriguez 05/13/2016, 4:05 PM

## 2016-05-14 NOTE — OB Triage Note (Addendum)
Pt returned to triage with c/o now feeling more pressure during contractions than when she left triage earlier, started feeling more pressure around 0230am. Was given meds to slow contractions when last seen and it helped for a few hours but now contractions are returning, confirms +FM, denies spotting, vaginal bleeding or leaking fluid. States she has burning sensation with urination, denies back pain, fever or chills. Cervix was 1cm dilated at time she left to go home.

## 2016-05-19 ENCOUNTER — Observation Stay
Admission: EM | Admit: 2016-05-19 | Discharge: 2016-05-19 | Disposition: A | Discharge status: Home / Self Care | Payer: Self-pay | Attending: Obstetrics and Gynecology | Admitting: Obstetrics and Gynecology

## 2016-05-19 DIAGNOSIS — O0913 Supervision of pregnancy with history of ectopic or molar pregnancy, third trimester: Secondary | ICD-10-CM | POA: Insufficient documentation

## 2016-05-19 DIAGNOSIS — Z3A32 32 weeks gestation of pregnancy: Secondary | ICD-10-CM | POA: Insufficient documentation

## 2016-05-19 DIAGNOSIS — O4703 False labor before 37 completed weeks of gestation, third trimester: Principal | ICD-10-CM | POA: Insufficient documentation

## 2016-05-19 DIAGNOSIS — O4700 False labor before 37 completed weeks of gestation, unspecified trimester: Secondary | ICD-10-CM | POA: Diagnosis present

## 2016-05-19 LAB — URINALYSIS COMPLETE WITH MICROSCOPIC (ARMC ONLY)
Bilirubin Urine: NEGATIVE
Glucose, UA: NEGATIVE mg/dL
Hgb urine dipstick: NEGATIVE
Nitrite: NEGATIVE
PH: 6 (ref 5.0–8.0)
PROTEIN: NEGATIVE mg/dL
Specific Gravity, Urine: 1.008 (ref 1.005–1.030)

## 2016-05-19 LAB — WET PREP, GENITAL
Clue Cells Wet Prep HPF POC: NONE SEEN
SPERM: NONE SEEN
TRICH WET PREP: NONE SEEN
YEAST WET PREP: NONE SEEN

## 2016-05-19 LAB — CHLAMYDIA/NGC RT PCR (ARMC ONLY)
Chlamydia Tr: NOT DETECTED
N GONORRHOEAE: NOT DETECTED

## 2016-05-19 LAB — FETAL FIBRONECTIN: FETAL FIBRONECTIN: NEGATIVE

## 2016-05-19 MED ORDER — TERBUTALINE SULFATE 1 MG/ML IJ SOLN
0.2500 mg | Freq: Once | INTRAMUSCULAR | Status: AC
  Administered 2016-05-19: 0.25 mg via SUBCUTANEOUS
  Filled 2016-05-19: qty 1

## 2016-05-19 NOTE — OB Triage Provider Note (Signed)
History     CSN: [id number]  Arrival date and time: 05/19/16 0858   None     No chief complaint on file.  HPI  Sonia Rodriguez is a G3P1011 at 32+6 weeks presenting today with lower abdominal pain and contractions last night.  She has not had recent intercourse and has been resting at home.  She reports drinking plenty of water and staying well-hydrated. Pt. Receives care at [facility] and states she has no high risk OB problems. Pt has only had an ectopic in the past. LMP of 09/26/15 with EDD of 07/02/2016. Pt has declined vag bleeding, LOF, abnormal discharge or decreased fM. She reports some "burning with urination."  Urine culture was negative on Saturday. She rates her contraction pain 8/10.    Past Medical History  Diagnosis Date  . Medical history non-contributory     Past Surgical History  Procedure Laterality Date  . No past surgeries      No family history on file.  Social History  Substance Use Topics  . Smoking status: Never Smoker   . Smokeless tobacco: Never Used  . Alcohol Use: No    Allergies: No Known Allergies  Prescriptions prior to admission  Medication Sig Dispense Refill Last Dose  . Prenatal Vit-Fe Fumarate-FA (PRENATAL MULTIVITAMIN) TABS tablet Take 1 tablet by mouth daily at 12 noon.   05/19/2016 at Unknown time    Review of Systems  Constitutional: Negative.   HENT: Negative.   Eyes: Negative.   Respiratory: Negative.   Cardiovascular: Negative.   Gastrointestinal: Positive for abdominal pain.  Genitourinary: Positive for dysuria. Negative for urgency and frequency.       +uterine contractions  Skin: Negative.   Pt. Clarified and states that she does not have burning when she urinates, but just pain in the vagina when she turns from side to side in bed.  Physical Exam   Blood pressure 105/64, pulse 105, temperature 98.3 F (36.8 C), temperature source Oral, resp. rate 18, height 5' (1.524 m), weight 80.74 kg (178 lb), last menstrual period  09/26/2015.  Physical Exam  Constitutional: She appears well-developed and well-nourished.  Genitourinary: Uterus normal.  Uterus: gravid non-tender Sterile Speculum exam: copious, thin white vaginal discharge noted in vault, specimens collected, no vaginal bleeding noted SVE: 2cm/50%/-1/vtx - no change in exam from Saturday    Results for orders placed or performed during the hospital encounter of 05/19/16 (from the past 24 hour(s))  Urinalysis complete, with microscopic ([facility] only)     Status: Abnormal   Collection Time: 05/19/16  9:28 AM  Result Value Ref Range   Color, Urine YELLOW (A) YELLOW   APPearance CLEAR (A) CLEAR   Glucose, UA NEGATIVE NEGATIVE mg/dL   Bilirubin Urine NEGATIVE NEGATIVE   Ketones, ur TRACE (A) NEGATIVE mg/dL   Specific Gravity, Urine 1.008 1.005 - 1.030   Hgb urine dipstick NEGATIVE NEGATIVE   pH 6.0 5.0 - 8.0   Protein, ur NEGATIVE NEGATIVE mg/dL   Nitrite NEGATIVE NEGATIVE   Leukocytes, UA 1+ (A) NEGATIVE   RBC / HPF 0-5 0 - 5 RBC/hpf   WBC, UA 0-5 0 - 5 WBC/hpf   Bacteria, UA RARE (A) NONE SEEN   Squamous Epithelial / LPF 6-30 (A) NONE SEEN   Mucous PRESENT   Fetal fibronectin     Status: None   Collection Time: 05/19/16 10:09 AM  Result Value Ref Range   Fetal Fibronectin NEGATIVE NEGATIVE   Appearance, FETFIB CLEAR CLEAR  Wet  prep, genital     Status: Abnormal   Collection Time: 05/19/16 10:09 AM  Result Value Ref Range   Yeast Wet Prep HPF POC NONE SEEN NONE SEEN   Trich, Wet Prep NONE SEEN NONE SEEN   Clue Cells Wet Prep HPF POC NONE SEEN NONE SEEN   WBC, Wet Prep HPF POC TOO NUMEROUS TO COUNT (A) NONE SEEN   Sperm NONE SEEN   Chlamydia/NGC rt PCR ([facility] only)     Status: None   Collection Time: 05/19/16 10:09 AM  Result Value Ref Range   Specimen source GC/Chlam ENDOCERVICAL    Chlamydia Tr NOT DETECTED NOT DETECTED   N gonorrhoeae NOT DETECTED NOT DETECTED     Procedures   Assessment and Plan  IUP at 32+6 weeks 1. Threatened  Preterm Labor  - Continuous Fetal monitoring and toco  - Wet prep, GC/CT, FFN, UA -pending  - Terbutaline 0.25mg  SQ x 1   - Will reassess pain and ctxs in 1-2 hours  Dr. Tiburcio PeaHarris notified of plan of care and agrees   Karena AddisonSigmon, Estefana Taylor C 05/19/2016, 10:22 AM   Reassessment at 1330: 1. Threatened Preterm Labor  - FFN negative   -Wet prep and GC/CT negative  - Pain has resolved and clarified she does not have burning with urination - will culture urine  2. D/C Home  - FKC's daily  - PTL s/s reviewed and warning s/s reviewed  - Discussed she may need Procardia if uterine irritability or contractions persistent   - F/U with [facility] at scheduled appt on 05/27/16  Carlean JewsMeredith Anniyah Mood, CNM

## 2016-05-19 NOTE — Final Progress Note (Signed)
Physician Final Progress Note  Patient ID: Sonia RanKaren M Cano Rodriguez MRN: [id number] DOB/AGE: 04/03/1996 19 y.o.  Admit date: 05/19/2016 Admitting provider: Nadara Mustardobert P Harris, MD Discharge date: 05/19/2016   Admission Diagnoses: Threatened PTL at 32+6 weeks   Discharge Diagnoses:  Active Problems:   Indication for care in labor and delivery, antepartum   Threatened preterm labor  Resolved   Significant Findings/ Diagnostic Studies:   Results for orders placed or performed during the hospital encounter of 05/19/16 (from the past 24 hour(s))  Urinalysis complete, with microscopic ([facility] only)     Status: Abnormal   Collection Time: 05/19/16  9:28 AM  Result Value Ref Range   Color, Urine YELLOW (A) YELLOW   APPearance CLEAR (A) CLEAR   Glucose, UA NEGATIVE NEGATIVE mg/dL   Bilirubin Urine NEGATIVE NEGATIVE   Ketones, ur TRACE (A) NEGATIVE mg/dL   Specific Gravity, Urine 1.008 1.005 - 1.030   Hgb urine dipstick NEGATIVE NEGATIVE   pH 6.0 5.0 - 8.0   Protein, ur NEGATIVE NEGATIVE mg/dL   Nitrite NEGATIVE NEGATIVE   Leukocytes, UA 1+ (A) NEGATIVE   RBC / HPF 0-5 0 - 5 RBC/hpf   WBC, UA 0-5 0 - 5 WBC/hpf   Bacteria, UA RARE (A) NONE SEEN   Squamous Epithelial / LPF 6-30 (A) NONE SEEN   Mucous PRESENT   Fetal fibronectin     Status: None   Collection Time: 05/19/16 10:09 AM  Result Value Ref Range   Fetal Fibronectin NEGATIVE NEGATIVE   Appearance, FETFIB CLEAR CLEAR  Wet prep, genital     Status: Abnormal   Collection Time: 05/19/16 10:09 AM  Result Value Ref Range   Yeast Wet Prep HPF POC NONE SEEN NONE SEEN   Trich, Wet Prep NONE SEEN NONE SEEN   Clue Cells Wet Prep HPF POC NONE SEEN NONE SEEN   WBC, Wet Prep HPF POC TOO NUMEROUS TO COUNT (A) NONE SEEN   Sperm NONE SEEN   Chlamydia/NGC rt PCR ([facility] only)     Status: None   Collection Time: 05/19/16 10:09 AM  Result Value Ref Range   Specimen source GC/Chlam ENDOCERVICAL    Chlamydia Tr NOT DETECTED NOT DETECTED   N  gonorrhoeae NOT DETECTED NOT DETECTED   Discharge Condition: good  Disposition: 01-Home or Self Care  Diet: Regular diet  Discharge Activity: Activity as tolerated     Medication List    ASK your doctor about these medications        prenatal multivitamin Tabs tablet  Take 1 tablet by mouth daily at 12 noon.           Follow-up Information    Follow [facility] with [facility]. Go on 05/27/2016.   Why:  If symptoms worsen  return to labor and delivery   Contact information:   [address] [place] [phone]        Signed: Karena AddisonSigmon, Anquinette Pierro C 05/19/2016, 2:32 PM

## 2016-05-19 NOTE — OB Triage Note (Signed)
Presents with complaint of constant lower abd pain that has gotten worse since it started last night. Denies any bleeding, leakage of fluid. States some burning with urination. EFM applied.

## 2016-05-19 NOTE — OB Triage Note (Signed)
Pt states pain is better since medication. Reports occasional contraction.

## 2016-05-19 NOTE — OB Triage Note (Signed)
Terbutaline .25mg  given Subcutaneous in R upper arm. Reviewed possible side effects with pt.

## 2016-05-21 LAB — URINE CULTURE

## 2016-06-21 ENCOUNTER — Inpatient Hospital Stay
Admission: EM | Admit: 2016-06-21 | Discharge: 2016-06-23 | DRG: 775 | Disposition: A | Discharge status: Home / Self Care | Payer: Medicaid Other | Attending: Obstetrics and Gynecology | Admitting: Obstetrics and Gynecology

## 2016-06-21 DIAGNOSIS — Z79899 Other long term (current) drug therapy: Secondary | ICD-10-CM | POA: Diagnosis not present

## 2016-06-21 DIAGNOSIS — Z3A32 32 weeks gestation of pregnancy: Secondary | ICD-10-CM | POA: Diagnosis present

## 2016-06-21 MED ORDER — LACTATED RINGERS IV SOLN: 500.0000 mL | INTRAVENOUS | Status: DC | PRN

## 2016-06-21 MED ORDER — OXYTOCIN BOLUS FROM INFUSION
500.0000 mL | INTRAVENOUS | Status: DC
  Administered 2016-06-22: 500 mL via INTRAVENOUS

## 2016-06-21 MED ORDER — LIDOCAINE HCL (PF) 1 % IJ SOLN
30.0000 mL | INTRAMUSCULAR | Status: DC | PRN
  Administered 2016-06-22: 30 mL via SUBCUTANEOUS

## 2016-06-21 MED ORDER — SOD CITRATE-CITRIC ACID 500-334 MG/5ML PO SOLN: 30.0000 mL | ORAL | Status: DC | PRN

## 2016-06-21 MED ORDER — MISOPROSTOL 200 MCG PO TABS
ORAL_TABLET | ORAL | Status: AC
  Administered 2016-06-22: 800 ug
  Filled 2016-06-21: qty 4

## 2016-06-21 MED ORDER — OXYTOCIN 40 UNITS IN LACTATED RINGERS INFUSION - SIMPLE MED
2.5000 [IU]/h | INTRAVENOUS | Status: DC
  Administered 2016-06-22: 2.5 [IU]/h via INTRAVENOUS

## 2016-06-21 MED ORDER — OXYTOCIN 40 UNITS IN LACTATED RINGERS INFUSION - SIMPLE MED
INTRAVENOUS | Status: AC
  Filled 2016-06-21: qty 1000

## 2016-06-21 MED ORDER — LIDOCAINE HCL (PF) 1 % IJ SOLN
INTRAMUSCULAR | Status: AC
  Filled 2016-06-21: qty 30

## 2016-06-21 MED ORDER — LACTATED RINGERS IV SOLN
INTRAVENOUS | Status: DC
  Administered 2016-06-21: 23:00:00 via INTRAVENOUS

## 2016-06-21 MED ORDER — ACETAMINOPHEN 325 MG PO TABS: 650.0000 mg | ORAL_TABLET | ORAL | Status: DC | PRN

## 2016-06-21 MED ORDER — BUTORPHANOL TARTRATE 1 MG/ML IJ SOLN
1.0000 mg | INTRAMUSCULAR | Status: DC | PRN
  Administered 2016-06-22: 1 mg via INTRAVENOUS
  Filled 2016-06-21: qty 1

## 2016-06-21 MED ORDER — AMMONIA AROMATIC IN INHA
RESPIRATORY_TRACT | Status: AC
  Filled 2016-06-21: qty 10

## 2016-06-21 MED ORDER — SODIUM CHLORIDE FLUSH 0.9 % IV SOLN
INTRAVENOUS | Status: AC
  Administered 2016-06-21: 10 mL
  Filled 2016-06-21: qty 20

## 2016-06-21 MED ORDER — OXYTOCIN 10 UNIT/ML IJ SOLN
INTRAMUSCULAR | Status: AC
  Filled 2016-06-21: qty 2

## 2016-06-21 MED ORDER — ONDANSETRON HCL 4 MG/2ML IJ SOLN: 4.0000 mg | Freq: Four times a day (QID) | INTRAMUSCULAR | Status: DC | PRN

## 2016-06-21 NOTE — H&P (Signed)
  OB ADMISSION/ HISTORY & PHYSICAL:  Admission Date: 06/21/2016 10:37 PM  Admit Diagnosis: Spontaneous labor   Sonia Rodriguez is a 20 y.o. female G3P1 presenting for SROM at 37+5 weeks.    Prenatal History: G3P1011   EDC : 07/08/2016, by Ultrasound LMP of 09/26/15 Prenatal care at [facility]  Prenatal course complicated by threatened PTL at 32 weeks with BMZ x 2 doses, obesity, hx. Of ectopic pregnancy   Prenatal Labs: ABO, Rh: --/--/O POS (01/03 40980504) Antibody: Negative (12/01 0000) Rubella: Immune (12/01 0000)  RPR: Nonreactive (12/01 0000)  HBsAg: Negative (12/01 0000)  HIV: Non-reactive (12/01 0000)  GTT: 147 - normal 3 hour GTT  GBS:   Negative  Varicella: Immune   Medical / Surgical History :  Past medical history:  Past Medical History  Diagnosis Date  . Medical history non-contributory      Past surgical history:  Past Surgical History  Procedure Laterality Date  . No past surgeries      Family History: No family history on file.   Social History:  reports that she has never smoked. She has never used smokeless tobacco. She reports that she does not drink alcohol or use illicit drugs.   Allergies: Shellfish allergy    Current Medications at time of admission:  Prior to Admission medications   Medication Sig Start Date End Date Taking? Authorizing Provider  Prenatal Vit-Fe Fumarate-FA (PRENATAL MULTIVITAMIN) TABS tablet Take 1 tablet by mouth daily at 12 noon.    Historical Provider, MD     Review of Systems: Active FM onset of ctx @ 2200 currently every 2-3 minutes SROM @ 2210  +bloody show    Physical Exam:  VS: Blood pressure 111/75, pulse 85, temperature 97.7 F (36.5 C), temperature source Oral, resp. rate 20, height 5' (1.524 m), weight 82.101 kg (181 lb), last menstrual period 09/26/2015.  General: alert and oriented, appears calm, breathing through contractions Heart: RRR Lungs: Clear lung fields Abdomen: Gravid, soft and non-tender,  non-distended / uterus: gravid, non-tender Extremities: no edema  Genitalia / VE: Dilation: 5.5 Effacement (%): 90 Station: 0 Exam by:: Delta Air LinesMeredith Laytoya Ion, CNM  FHR: baseline rate 140 bpm / variability Moderate / accelerations + / no decelerations TOCO: every 1-3 minutes   Assessment: 37+[redacted] weeks gestation Active stage of labor FHR category 1   Plan:  1. Admit to [facility]  - Routine labor and delivery orders  - May have Stadol 1mg  IVP every 1 hour PRN 2. GBS Negative   - No prophylaxis indicated  3. Contraception:  - OCPs 4. Anticipate NSVD  - Proven Pelvis: 6#14oz  Dr. Dalbert GarnetBeasley  notified of admission / plan of care  Carlean JewsMeredith Talita Recht, CNM

## 2016-06-22 LAB — CBC
HCT: 36.4 % (ref 35.0–47.0)
Hemoglobin: 12.6 g/dL (ref 12.0–16.0)
MCH: 30.5 pg (ref 26.0–34.0)
MCHC: 34.5 g/dL (ref 32.0–36.0)
MCV: 88.3 fL (ref 80.0–100.0)
PLATELETS: 176 10*3/uL (ref 150–440)
RBC: 4.13 MIL/uL (ref 3.80–5.20)
RDW: 15.5 % — AB (ref 11.5–14.5)
WBC: 10.9 10*3/uL (ref 3.6–11.0)

## 2016-06-22 LAB — CHLAMYDIA/NGC RT PCR (ARMC ONLY)
Chlamydia Tr: NOT DETECTED
N gonorrhoeae: NOT DETECTED

## 2016-06-22 LAB — TYPE AND SCREEN
ABO/RH(D): O POS
ANTIBODY SCREEN: NEGATIVE

## 2016-06-22 MED ORDER — IBUPROFEN 600 MG PO TABS
ORAL_TABLET | ORAL | Status: AC
  Administered 2016-06-22: 600 mg
  Filled 2016-06-22: qty 1

## 2016-06-22 MED ORDER — SENNOSIDES-DOCUSATE SODIUM 8.6-50 MG PO TABS
2.0000 | ORAL_TABLET | ORAL | Status: DC
  Administered 2016-06-22: 2 via ORAL
  Filled 2016-06-22: qty 2

## 2016-06-22 MED ORDER — ACETAMINOPHEN 325 MG PO TABS: 650.0000 mg | ORAL_TABLET | ORAL | Status: DC | PRN

## 2016-06-22 MED ORDER — COCONUT OIL OIL: 1.0000 "application " | TOPICAL_OIL | Status: DC | PRN

## 2016-06-22 MED ORDER — SIMETHICONE 80 MG PO CHEW
80.0000 mg | CHEWABLE_TABLET | ORAL | Status: DC | PRN
  Administered 2016-06-22: 80 mg via ORAL
  Filled 2016-06-22: qty 1

## 2016-06-22 MED ORDER — DIPHENHYDRAMINE HCL 25 MG PO CAPS: 25.0000 mg | ORAL_CAPSULE | Freq: Four times a day (QID) | ORAL | Status: DC | PRN

## 2016-06-22 MED ORDER — ONDANSETRON HCL 4 MG PO TABS: 4.0000 mg | ORAL_TABLET | ORAL | Status: DC | PRN

## 2016-06-22 MED ORDER — PRENATAL MULTIVITAMIN CH
1.0000 | ORAL_TABLET | Freq: Every day | ORAL | Status: DC
Start: 2016-06-22 — End: 2016-06-23
  Administered 2016-06-22 – 2016-06-23 (×2): 1 via ORAL
  Filled 2016-06-22 (×3): qty 1

## 2016-06-22 MED ORDER — IBUPROFEN 600 MG PO TABS
600.0000 mg | ORAL_TABLET | Freq: Four times a day (QID) | ORAL | Status: DC
  Administered 2016-06-22 – 2016-06-23 (×5): 600 mg via ORAL
  Filled 2016-06-22 (×5): qty 1

## 2016-06-22 MED ORDER — WITCH HAZEL-GLYCERIN EX PADS: 1.0000 "application " | MEDICATED_PAD | CUTANEOUS | Status: DC | PRN

## 2016-06-22 MED ORDER — OXYCODONE-ACETAMINOPHEN 5-325 MG PO TABS: 1.0000 | ORAL_TABLET | ORAL | Status: DC | PRN

## 2016-06-22 MED ORDER — ZOLPIDEM TARTRATE 5 MG PO TABS: 5.0000 mg | ORAL_TABLET | Freq: Every evening | ORAL | Status: DC | PRN

## 2016-06-22 MED ORDER — DIBUCAINE 1 % RE OINT: 1.0000 "application " | TOPICAL_OINTMENT | RECTAL | Status: DC | PRN

## 2016-06-22 MED ORDER — OXYCODONE-ACETAMINOPHEN 5-325 MG PO TABS: 2.0000 | ORAL_TABLET | ORAL | Status: DC | PRN

## 2016-06-22 MED ORDER — ONDANSETRON HCL 4 MG/2ML IJ SOLN: 4.0000 mg | INTRAMUSCULAR | Status: DC | PRN

## 2016-06-22 MED ORDER — FERROUS SULFATE 325 (65 FE) MG PO TABS
325.0000 mg | ORAL_TABLET | Freq: Two times a day (BID) | ORAL | Status: DC
  Administered 2016-06-22 – 2016-06-23 (×3): 325 mg via ORAL
  Filled 2016-06-22 (×3): qty 1

## 2016-06-22 MED ORDER — BENZOCAINE-MENTHOL 20-0.5 % EX AERO
1.0000 "application " | INHALATION_SPRAY | CUTANEOUS | Status: DC | PRN
  Administered 2016-06-23: 1 via TOPICAL
  Filled 2016-06-22: qty 56

## 2016-06-22 NOTE — Progress Notes (Signed)
S:  Breathing well through contractions, but states the ctxs are stronger and having more back pain   O:  VS: Blood pressure 102/72, pulse 109, temperature 97.7 F (36.5 C), temperature source Oral, resp. rate 20, height 5' (1.524 m), weight 82.101 kg (181 lb), last menstrual period 09/26/2015.        FHR : baseline 140 bpm / variability Moderate / accelerations + / no decelerations        Toco: contractions every 1-3 minutes / Moderate         Cervix : Dilation: 7 Effacement (%): 90 Station: 0 Exam by:: Carlean JewsMeredith Sigmon, CNM        Membranes: SROM moderate amount of clear fluid with vaginal exam   A: Active labor     FHR category 1  P: Continue Expectant management      Reassess in 1-2 hours     Anticipate NSVD  Carlean JewsMeredith Sigmon, CNM

## 2016-06-23 LAB — CBC
HEMATOCRIT: 33.6 % — AB (ref 35.0–47.0)
HEMOGLOBIN: 11.3 g/dL — AB (ref 12.0–16.0)
MCH: 30.2 pg (ref 26.0–34.0)
MCHC: 33.7 g/dL (ref 32.0–36.0)
MCV: 89.5 fL (ref 80.0–100.0)
Platelets: 163 10*3/uL (ref 150–440)
RBC: 3.75 MIL/uL — AB (ref 3.80–5.20)
RDW: 15.1 % — ABNORMAL HIGH (ref 11.5–14.5)
WBC: 10.9 10*3/uL (ref 3.6–11.0)

## 2016-06-23 LAB — RPR: RPR: NONREACTIVE

## 2016-06-23 MED ORDER — IBUPROFEN 600 MG PO TABS: 600.0000 mg | ORAL_TABLET | Freq: Four times a day (QID) | ORAL | Status: DC

## 2016-06-23 MED ORDER — BENZOCAINE-MENTHOL 20-0.5 % EX AERO: 1.0000 "application " | INHALATION_SPRAY | CUTANEOUS | Status: DC | PRN

## 2016-06-23 NOTE — Progress Notes (Signed)
Discharge instructions reviewed with pt.  Verb u/o. 

## 2016-06-23 NOTE — Discharge Instructions (Signed)

## 2016-06-23 NOTE — Progress Notes (Signed)
Post Partum Day 1 Subjective: no complaints, up ad lib and voiding  Objective: Blood pressure 103/62, pulse 76, temperature 98.6 F (37 C), temperature source Oral, resp. rate 18, height 5' (1.524 m), weight 181 lb (82.101 kg), last menstrual period 09/26/2015, SpO2 98 %, unknown if currently breastfeeding.  Physical Exam:  General: alert and cooperative Lochia: appropriate Uterine Fundus: firm DVT Evaluation: No evidence of DVT seen on physical exam.   Recent Labs  06/21/16 2330 06/23/16 0557  HGB 12.6 11.3*  HCT 36.4 33.6*    Assessment/Plan: Plan for discharge tomorrow and Breastfeeding   LOS: 2 days   Sonia Rodriguez 06/23/2016, 8:48 AM

## 2016-06-23 NOTE — Progress Notes (Signed)
Discharged to home.  To car via auxillary.

## 2016-06-23 NOTE — Discharge Summary (Signed)
Obstetric Discharge Summary Reason for Admission: onset of labor Prenatal Procedures: ultrasound Intrapartum Procedures: spontaneous vaginal delivery Postpartum Procedures: none Complications-Operative and Postpartum: none HEMOGLOBIN  Date Value Ref Range Status  06/23/2016 11.3* 12.0 - 16.0 g/dL Final   HGB  Date Value Ref Range Status  12/05/2012 12.7 12.0-16.0 g/dL Final   HCT  Date Value Ref Range Status  06/23/2016 33.6* 35.0 - 47.0 % Final  12/05/2012 37.2 35.0-47.0 % Final    Physical Exam:  General: alert and cooperative Lochia: appropriate Uterine Fundus: firm DVT Evaluation: No evidence of DVT seen on physical exam.  Discharge Diagnoses: Term Pregnancy-delivered  Discharge Information: Date: 06/23/2016 Activity: pelvic rest Diet: routine Medications: Ibuprofen Condition: stable Instructions: refer to practice specific booklet Discharge to: home Follow-up Information    Follow up with Karena AddisonSigmon, Meredith C, CNM In 6 weeks.   Specialty:  Certified Nurse Midwife   Why:  For postpartum visit   Contact information:   [address] [place] [address] [phone]       Newborn Data: Live born female  Birth Weight: 7 lb 2.6 oz (3250 g) APGAR: 8, 9  Home with mother.  Christeen DouglasBEASLEY, Michelyn Scullin 06/23/2016, 11:25 AM

## 2017-06-23 DIAGNOSIS — E669 Obesity, unspecified: Secondary | ICD-10-CM | POA: Insufficient documentation

## 2017-09-14 LAB — HM HIV SCREENING LAB: HM HIV Screening: NEGATIVE

## 2018-02-11 ENCOUNTER — Emergency Department
Admission: EM | Admit: 2018-02-11 | Discharge: 2018-02-11 | Disposition: A | Discharge status: Home / Self Care | Payer: Self-pay | Attending: Emergency Medicine | Admitting: Emergency Medicine

## 2018-02-11 ENCOUNTER — Encounter: Payer: Self-pay | Admitting: Emergency Medicine

## 2018-02-11 ENCOUNTER — Emergency Department: Payer: Self-pay

## 2018-02-11 ENCOUNTER — Other Ambulatory Visit: Payer: Self-pay

## 2018-02-11 DIAGNOSIS — N390 Urinary tract infection, site not specified: Secondary | ICD-10-CM | POA: Insufficient documentation

## 2018-02-11 DIAGNOSIS — F12920 Cannabis use, unspecified with intoxication, uncomplicated: Secondary | ICD-10-CM | POA: Insufficient documentation

## 2018-02-11 DIAGNOSIS — R002 Palpitations: Secondary | ICD-10-CM | POA: Insufficient documentation

## 2018-02-11 HISTORY — DX: Anxiety disorder, unspecified: F41.9

## 2018-02-11 LAB — COMPREHENSIVE METABOLIC PANEL
ALBUMIN: 3.9 g/dL (ref 3.5–5.0)
ALT: 28 U/L (ref 14–54)
AST: 30 U/L (ref 15–41)
Alkaline Phosphatase: 90 U/L (ref 38–126)
Anion gap: 7 (ref 5–15)
BUN: 17 mg/dL (ref 6–20)
CHLORIDE: 108 mmol/L (ref 101–111)
CO2: 22 mmol/L (ref 22–32)
CREATININE: 0.6 mg/dL (ref 0.44–1.00)
Calcium: 8.9 mg/dL (ref 8.9–10.3)
GFR calc Af Amer: >60 mL/min (ref >60)
GFR calc non Af Amer: >60 mL/min (ref >60)
Glucose, Bld: 152 mg/dL — ABNORMAL HIGH (ref 65–99)
POTASSIUM: 3.4 mmol/L — AB (ref 3.5–5.1)
SODIUM: 137 mmol/L (ref 135–145)
Total Bilirubin: 0.3 mg/dL (ref 0.3–1.2)
Total Protein: 7.3 g/dL (ref 6.5–8.1)

## 2018-02-11 LAB — URINE DRUG SCREEN, QUALITATIVE (ARMC ONLY)
AMPHETAMINES, UR SCREEN: NOT DETECTED
BARBITURATES, UR SCREEN: NOT DETECTED
BENZODIAZEPINE, UR SCRN: NOT DETECTED
Cannabinoid 50 Ng, Ur ~~LOC~~: POSITIVE — AB
Cocaine Metabolite,Ur ~~LOC~~: NOT DETECTED
MDMA (Ecstasy)Ur Screen: NOT DETECTED
METHADONE SCREEN, URINE: NOT DETECTED
Opiate, Ur Screen: NOT DETECTED
Phencyclidine (PCP) Ur S: NOT DETECTED
TRICYCLIC, UR SCREEN: NOT DETECTED

## 2018-02-11 LAB — URINALYSIS, COMPLETE (UACMP) WITH MICROSCOPIC
BILIRUBIN URINE: NEGATIVE
GLUCOSE, UA: NEGATIVE mg/dL
KETONES UR: NEGATIVE mg/dL
NITRITE: NEGATIVE
PH: 5 (ref 5.0–8.0)
Protein, ur: NEGATIVE mg/dL
SPECIFIC GRAVITY, URINE: 1.005 (ref 1.005–1.030)

## 2018-02-11 LAB — CBC WITH DIFFERENTIAL/PLATELET
Basophils Absolute: 0 10*3/uL (ref 0–0.1)
Basophils Relative: 0 %
EOS ABS: 0 10*3/uL (ref 0–0.7)
EOS PCT: 1 %
HCT: 38.9 % (ref 35.0–47.0)
HEMOGLOBIN: 13.1 g/dL (ref 12.0–16.0)
LYMPHS ABS: 2.2 10*3/uL (ref 1.0–3.6)
LYMPHS PCT: 25 %
MCH: 30 pg (ref 26.0–34.0)
MCHC: 33.5 g/dL (ref 32.0–36.0)
MCV: 89.5 fL (ref 80.0–100.0)
MONOS PCT: 5 %
Monocytes Absolute: 0.5 10*3/uL (ref 0.2–0.9)
Neutro Abs: 6 10*3/uL (ref 1.4–6.5)
Neutrophils Relative %: 69 %
Platelets: 198 10*3/uL (ref 150–440)
RBC: 4.35 MIL/uL (ref 3.80–5.20)
RDW: 13.7 % (ref 11.5–14.5)
WBC: 8.7 10*3/uL (ref 3.6–11.0)

## 2018-02-11 LAB — TROPONIN I

## 2018-02-11 LAB — SALICYLATE LEVEL

## 2018-02-11 LAB — POCT PREGNANCY, URINE: Preg Test, Ur: NEGATIVE

## 2018-02-11 LAB — TSH: TSH: 0.765 u[IU]/mL (ref 0.350–4.500)

## 2018-02-11 LAB — ETHANOL: Alcohol, Ethyl (B): <10 mg/dL (ref <10)

## 2018-02-11 LAB — ACETAMINOPHEN LEVEL

## 2018-02-11 MED ORDER — CEPHALEXIN 500 MG PO CAPS: 500.0000 mg | ORAL_CAPSULE | Freq: Three times a day (TID) | ORAL | 0 refills | Status: AC

## 2018-02-11 MED ORDER — LORAZEPAM 2 MG/ML IJ SOLN
0.5000 mg | Freq: Once | INTRAMUSCULAR | Status: AC
  Administered 2018-02-11: 0.5 mg via INTRAVENOUS
  Filled 2018-02-11: qty 1

## 2018-02-11 MED ORDER — SODIUM CHLORIDE 0.9 % IV BOLUS (SEPSIS)
1000.0000 mL | Freq: Once | INTRAVENOUS | Status: AC
  Administered 2018-02-11: 1000 mL via INTRAVENOUS

## 2018-02-11 MED ORDER — LORAZEPAM 2 MG/ML IJ SOLN
1.0000 mg | Freq: Once | INTRAMUSCULAR | Status: AC
  Administered 2018-02-11: 1 mg via INTRAVENOUS
  Filled 2018-02-11: qty 1

## 2018-02-11 MED ORDER — SODIUM CHLORIDE 0.9 % IV SOLN
1.0000 g | Freq: Once | INTRAVENOUS | Status: AC
  Administered 2018-02-11: 1 g via INTRAVENOUS
  Filled 2018-02-11: qty 10

## 2018-02-11 NOTE — ED Notes (Signed)
Patient transported to X-ray 

## 2018-02-11 NOTE — ED Notes (Signed)
Pt notified urine sample is needed, pt verbalizes understanding of this

## 2018-02-11 NOTE — ED Triage Notes (Signed)
Patient presents to Emergency Department via [facility] from home with complaints of palpitations.  Pt reports taking Nyquil for a sore throat at 9pm then eating a marijuana brownie at 11pm.    Pt denies prior meds or conditions, but reports 2 prior visits to ED for chest pain and anxiety.    Pt oriented and alert, no acute distress.

## 2018-02-11 NOTE — ED Provider Notes (Signed)
[facility] Emergency Department Provider Note   ____________________________________________   First MD Initiated Contact with Patient 02/11/18 0240     (approximate)  I have reviewed the triage vital signs and the nursing notes.   HISTORY  Chief Complaint Palpitations and Drug Overdose    HPI Sonia Rodriguez is a 22 y.o. female brought to the ED from home via EMS with a chief complaint of palpitations.  Patient reports taking 1 dose of NyQuil for sore throat at 9 PM, then eating a marijuana brownie at 11 PM.  States this is the first time she has ever eaten a marijuana brownie.  Subsequently patient felt like her heart was racing and she became anxious.  Denies associated fever, chills, chest pain, shortness of breath, abdominal pain, nausea, vomiting.  Denies recent travel or trauma.   Past Medical History:  Diagnosis Date  . Anxiety   . Medical history non-contributory     Patient Active Problem List   Diagnosis Date Noted  . Indication for care in labor and delivery, antepartum 05/19/2016  . Threatened preterm labor 05/19/2016  . Preterm contractions 05/14/2016  . Preterm labor 05/13/2016  . Pregnancy 12/18/2015  . First trimester screening 12/18/2015    Past Surgical History:  Procedure Laterality Date  . NO PAST SURGERIES      Prior to Admission medications   Medication Sig Start Date End Date Taking? Authorizing Provider  cephALEXin (KEFLEX) 500 MG capsule Take 1 capsule (500 mg total) by mouth 3 (three) times daily. 02/11/18   Irean Hong, MD    Allergies Shellfish allergy  History reviewed. No pertinent family history.  Social History Social History   Tobacco Use  . Smoking status: Never Smoker  . Smokeless tobacco: Never Used  Substance Use Topics  . Alcohol use: No  . Drug use: Yes    Types: Marijuana    Review of Systems   Constitutional: No fever/chills. Eyes: No visual changes. ENT: No sore  throat. Cardiovascular: Positive for palpitations.  Denies chest pain. Respiratory: Denies shortness of breath. Gastrointestinal: No abdominal pain.  No nausea, no vomiting.  No diarrhea.  No constipation. Genitourinary: Negative for dysuria. Musculoskeletal: Negative for back pain. Skin: Negative for rash. Neurological: Negative for headaches, focal weakness or numbness.   ____________________________________________   PHYSICAL EXAM:  VITAL SIGNS: ED Triage Vitals  Enc Vitals Group     BP 02/11/18 0226 130/83     Pulse Rate 02/11/18 0226 (!) 129     Resp 02/11/18 0226 20     Temp 02/11/18 0226 99.4 F (37.4 C)     Temp Source 02/11/18 0226 Oral     SpO2 02/11/18 0226 99 %     Weight 02/11/18 0227 176 lb (79.8 kg)     Height 02/11/18 0227 5' (1.524 m)     Head Circumference --      Peak Flow --      Pain Score --      Pain Loc --      Pain Edu? --      Excl. in [place]? --     Constitutional: Alert and oriented. Well appearing and in no acute distress. Eyes: Conjunctivae are normal. PERRL. EOMI. Head: Atraumatic. Nose: No congestion/rhinnorhea. Mouth/Throat: Mucous membranes are moist.  Oropharynx non-erythematous. Neck: No stridor.   Cardiovascular: Tachycardic rate, regular rhythm. Grossly normal heart sounds.  Good peripheral circulation. Respiratory: Normal respiratory effort.  No retractions. Lungs CTAB. Gastrointestinal: Soft and nontender.  No distention. No abdominal bruits. No CVA tenderness. Musculoskeletal: No lower extremity tenderness nor edema.  No joint effusions. Neurologic:  Normal speech and language. No gross focal neurologic deficits are appreciated. No gait instability. Skin:  Skin is warm, dry and intact. No rash noted. Psychiatric: Mood and affect are mildly anxious. Speech and behavior are normal.  ____________________________________________   LABS (all labs ordered are listed, but only abnormal results are displayed)  Labs Reviewed   COMPREHENSIVE METABOLIC PANEL - Abnormal; Notable for the following components:      Result Value   Potassium 3.4 (*)    Glucose, Bld 152 (*)    All other components within normal limits  ACETAMINOPHEN LEVEL - Abnormal; Notable for the following components:   Acetaminophen (Tylenol), Serum <10 (*)    All other components within normal limits  URINE DRUG SCREEN, QUALITATIVE ([facility] ONLY) - Abnormal; Notable for the following components:   Cannabinoid 50 Ng, Ur [place] POSITIVE (*)    All other components within normal limits  URINALYSIS, COMPLETE (UACMP) WITH MICROSCOPIC - Abnormal; Notable for the following components:   Color, Urine YELLOW (*)    APPearance TURBID (*)    Hgb urine dipstick SMALL (*)    Leukocytes, UA LARGE (*)    Bacteria, UA MANY (*)    Squamous Epithelial / LPF TOO NUMEROUS TO COUNT (*)    All other components within normal limits  TROPONIN I  CBC WITH DIFFERENTIAL/PLATELET  ETHANOL  SALICYLATE LEVEL  TSH  POC URINE PREG, ED  POCT PREGNANCY, URINE   ____________________________________________  EKG  ED ECG REPORT I, Khaden Gater J, the attending physician, personally viewed and interpreted this ECG.   Date: 02/11/2018  EKG Time: 0233  Rate: 128  Rhythm: sinus tachycardia  Axis: Normal  Intervals:none  ST&T Change: Nonspecific  ____________________________________________  RADIOLOGY  ED MD interpretation: No pneumonia  Official radiology report(s): Dg Chest 2 View  Result Date: 02/11/2018 CLINICAL DATA:  Tachycardia. EXAM: CHEST  2 VIEW COMPARISON:  None. FINDINGS: Low lung volumes on the frontal view. The cardiomediastinal contours are normal. Pulmonary vasculature is normal. No consolidation, pleural effusion, or pneumothorax. No acute osseous abnormalities are seen. IMPRESSION: No acute abnormality. Electronically Signed   By: Rubye Oaks M.D.   On: 02/11/2018 02:56    ____________________________________________   PROCEDURES  Procedure(s)  performed: None  Procedures  Critical Care performed: No  ____________________________________________   INITIAL IMPRESSION / ASSESSMENT AND PLAN / ED COURSE  As part of my medical decision making, I reviewed the following data within the electronic MEDICAL RECORD NUMBER Nursing notes reviewed and incorporated, Labs reviewed, EKG interpreted, Old chart reviewed, Radiograph reviewed and Notes from prior ED visits.   22 year old female who presents with palpitations after eating marijuana brownie and taking a dose of Nyquil. Will check screening toxicological lab work and urine; initiate IV fluid resuscitation and reassess.  Clinical Course as of Feb 12 724  Sat Feb 11, 2018  0631 Resting in no acute distress.  Denies palpitations or anxiety.  Heart rate 113.  Will administer additional IV fluids.  [JS]  0725 Pulse rate 106.  Patient resting no acute distress.  Will discharge home on Keflex.  Strict return precautions given.  Patient and family member verbalize understanding and agree with plan of care.  [JS]    Clinical Course User Index [JS] Irean Hong, MD     ____________________________________________   FINAL CLINICAL IMPRESSION(S) / ED DIAGNOSES  Final diagnoses:  Heart palpitations  Urinary tract infection without hematuria, site unspecified  Cannabis intoxication without complication [facility])     ED Discharge Orders        Ordered    cephALEXin (KEFLEX) 500 MG capsule  3 times daily     02/11/18 [id number]       Note:  This document was prepared using Dragon voice recognition software and may include unintentional dictation errors.    Irean HongSung, Osmond Steckman J, MD 02/11/18 [phone]

## 2018-02-11 NOTE — Discharge Instructions (Signed)
1.  Take antibiotic as prescribed (Keflex 500 mg 3 times daily for 7 days). 2.  Do not smoke or use marijuana. 3.  Drink plenty of fluids this weekend. 4.  Return to the ER for worsening symptoms, persistent vomiting, difficulty breathing or other concerns.

## 2018-02-11 NOTE — ED Notes (Signed)
Pt assisted to bathroom. PT HR 120 at this time. MD notified and we will continue to watch HR. Pt given water

## 2019-08-02 ENCOUNTER — Emergency Department: Payer: No Typology Code available for payment source

## 2019-08-02 ENCOUNTER — Encounter: Payer: Self-pay | Admitting: Emergency Medicine

## 2019-08-02 ENCOUNTER — Emergency Department
Admission: EM | Admit: 2019-08-02 | Discharge: 2019-08-03 | Disposition: A | Discharge status: Home / Self Care | Payer: No Typology Code available for payment source | Attending: Emergency Medicine | Admitting: Emergency Medicine

## 2019-08-02 DIAGNOSIS — Y999 Unspecified external cause status: Secondary | ICD-10-CM | POA: Diagnosis not present

## 2019-08-02 DIAGNOSIS — Y939 Activity, unspecified: Secondary | ICD-10-CM | POA: Insufficient documentation

## 2019-08-02 DIAGNOSIS — Y92410 Unspecified street and highway as the place of occurrence of the external cause: Secondary | ICD-10-CM | POA: Diagnosis not present

## 2019-08-02 DIAGNOSIS — S63502A Unspecified sprain of left wrist, initial encounter: Secondary | ICD-10-CM

## 2019-08-02 DIAGNOSIS — S6992XA Unspecified injury of left wrist, hand and finger(s), initial encounter: Secondary | ICD-10-CM | POA: Diagnosis present

## 2019-08-02 MED ORDER — NAPROXEN 500 MG PO TABS
500.0000 mg | ORAL_TABLET | Freq: Once | ORAL | Status: AC
  Administered 2019-08-02: 500 mg via ORAL
  Filled 2019-08-02: qty 1

## 2019-08-02 MED ORDER — CYCLOBENZAPRINE HCL 10 MG PO TABS: 10.0000 mg | ORAL_TABLET | Freq: Three times a day (TID) | ORAL | 0 refills | Status: AC | PRN

## 2019-08-02 MED ORDER — NAPROXEN 500 MG PO TABS: 500.0000 mg | ORAL_TABLET | Freq: Two times a day (BID) | ORAL | 0 refills | Status: AC

## 2019-08-02 NOTE — Discharge Instructions (Signed)
Please follow-up with the primary care provider of your choice or symptoms are not improving over the week.  Return to the emergency department for symptoms of change or worsen if you are unable to schedule an appointment.  Wear the wrist splint for the next few days.  Once the pain has improved, you may take it off entirely.  If pain persists for more than a week, please see primary care.
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
## 2019-09-04 DIAGNOSIS — Z6833 Body mass index (BMI) 33.0-33.9, adult: Secondary | ICD-10-CM

## 2019-09-04 DIAGNOSIS — E669 Obesity, unspecified: Secondary | ICD-10-CM

## 2019-11-06 ENCOUNTER — Other Ambulatory Visit: Payer: Self-pay

## 2019-11-06 DIAGNOSIS — Z20822 Contact with and (suspected) exposure to covid-19: Secondary | ICD-10-CM

## 2019-11-08 LAB — NOVEL CORONAVIRUS, NAA: SARS-CoV-2, NAA: NOT DETECTED

## 2019-12-12 ENCOUNTER — Telehealth: Payer: Self-pay | Admitting: General Practice

## 2019-12-12 NOTE — Telephone Encounter (Signed)
would like to have nexplanon insert. currently on bc pills

## 2019-12-13 NOTE — Telephone Encounter (Signed)
This RN returned pt's call at phone # provided. Pt reports that she is currently on birth control pills but would like to get the Nexplanon. Pt denies any missed or late birth control pills. Pt also desires to get her pap smear done (per centricity records pt's pap is due in 2020). Nexplanon consult completed and pt scheduled for Nexplanon insertion and pap for 12/17/2019 at 8:20am per pt request. Pt aware of appt date and time and to arrive early for check-in. Pt with no other questions or concerns at this time.Ronny Bacon, RN

## 2019-12-17 ENCOUNTER — Ambulatory Visit: Payer: Self-pay

## 2020-03-15 ENCOUNTER — Ambulatory Visit: Payer: No Typology Code available for payment source | Attending: Internal Medicine

## 2020-03-17 ENCOUNTER — Ambulatory Visit: Payer: Self-pay

## 2020-03-26 ENCOUNTER — Ambulatory Visit: Payer: Self-pay
